# Patient Record
Sex: Female | Born: 1995 | Race: White | Hispanic: No | Marital: Married | Smoking: Never smoker
Health system: Southern US, Community
[De-identification: ages and names within clinical notes are randomized; demographics above are authoritative.]

## PROBLEM LIST (undated history)

## (undated) DIAGNOSIS — N946 Dysmenorrhea, unspecified: Secondary | ICD-10-CM

## (undated) DIAGNOSIS — R7989 Other specified abnormal findings of blood chemistry: Secondary | ICD-10-CM

## (undated) DIAGNOSIS — N390 Urinary tract infection, site not specified: Secondary | ICD-10-CM

## (undated) DIAGNOSIS — F32A Depression, unspecified: Secondary | ICD-10-CM

## (undated) DIAGNOSIS — E039 Hypothyroidism, unspecified: Secondary | ICD-10-CM

## (undated) DIAGNOSIS — F419 Anxiety disorder, unspecified: Secondary | ICD-10-CM

## (undated) DIAGNOSIS — F909 Attention-deficit hyperactivity disorder, unspecified type: Secondary | ICD-10-CM

## (undated) DIAGNOSIS — B009 Herpesviral infection, unspecified: Secondary | ICD-10-CM

## (undated) HISTORY — DX: Urinary tract infection, site not specified: N39.0

## (undated) HISTORY — DX: Hypothyroidism, unspecified: E03.9

## (undated) HISTORY — DX: Dysmenorrhea, unspecified: N94.6

## (undated) HISTORY — DX: Anxiety disorder, unspecified: F41.9

## (undated) HISTORY — PX: WISDOM TOOTH EXTRACTION: SHX21

## (undated) HISTORY — DX: Attention-deficit hyperactivity disorder, unspecified type: F90.9

## (undated) HISTORY — DX: Herpesviral infection, unspecified: B00.9

## (undated) HISTORY — DX: Depression, unspecified: F32.A

## (undated) HISTORY — DX: Other specified abnormal findings of blood chemistry: R79.89

---
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
## 2013-02-04 ENCOUNTER — Other Ambulatory Visit: Payer: Self-pay | Admitting: Nurse Practitioner

## 2013-04-19 ENCOUNTER — Other Ambulatory Visit: Payer: Self-pay | Admitting: Nurse Practitioner

## 2013-04-20 NOTE — Telephone Encounter (Signed)
Last AEX 05/10/2012 Last refill 05/10/2012 x 1 year No future appt.   Will refill for 1 month. Patient will need AEX for further refills.

## 2013-05-28 ENCOUNTER — Other Ambulatory Visit: Payer: Self-pay | Admitting: Nurse Practitioner

## 2013-05-29 NOTE — Telephone Encounter (Signed)
Last refilled: 05/10/12 #90/0refills  AEX scheduled for 06/23/13 with PG  Junel 1/20 #28/0 refills sent in to last patient until AEX

## 2013-06-21 ENCOUNTER — Encounter: Payer: Self-pay | Admitting: Nurse Practitioner

## 2013-06-23 ENCOUNTER — Ambulatory Visit (INDEPENDENT_AMBULATORY_CARE_PROVIDER_SITE_OTHER): Payer: BC Managed Care – PPO | Admitting: Nurse Practitioner

## 2013-06-23 ENCOUNTER — Encounter: Payer: Self-pay | Admitting: Nurse Practitioner

## 2013-06-23 VITALS — BP 120/82 | HR 84 | Ht 64.5 in | Wt 121.0 lb

## 2013-06-23 DIAGNOSIS — Z0000 Encounter for general adult medical examination without abnormal findings: Secondary | ICD-10-CM

## 2013-06-23 DIAGNOSIS — F411 Generalized anxiety disorder: Secondary | ICD-10-CM

## 2013-06-23 DIAGNOSIS — Z01419 Encounter for gynecological examination (general) (routine) without abnormal findings: Secondary | ICD-10-CM

## 2013-06-23 MED ORDER — VALACYCLOVIR HCL 500 MG PO TABS: 500.0000 mg | ORAL_TABLET | Freq: Every day | ORAL | Status: DC

## 2013-06-23 MED ORDER — ESCITALOPRAM OXALATE 10 MG PO TABS: ORAL_TABLET | ORAL | Status: DC

## 2013-06-23 MED ORDER — NORETHIN ACE-ETH ESTRAD-FE 1-20 MG-MCG PO TABS: ORAL_TABLET | ORAL | Status: DC

## 2013-06-23 NOTE — Progress Notes (Signed)
Patient ID: Dana Welch, femalLenise Heralde   DOB: 03/15/1995, 18 y.o.   MRN: [id number] 18 y.o. G0P0 Single Caucasian Fe here for annual exam.  Menses for 3 days. Light to spotting.  Only 1 day of come cramps.  Bloating rare. Same partner for past 4 years.  No concerns about STD's.  [place]  He should be  home for the summer.  Patient is having difficulty with anxiety that seems to be triggered with every day life events.  For instance if she has too much homework, tries to get it done too quickly because the longer she waits gets overwhelmed. She has tried bio feed back techniques like deep breathing to the point that her chest hurts and she gets numbness and tingling of her hands. She has the normal times of presentations that trigger this.   But also if home and nothing going on she feels the need to go somewhere and be with someone so she is not anxious.  Patient's last menstrual period was 05/31/2003.          Sexually active: yes  The current method of family planning is TOCP (estrogen/progesterone).    Exercising: yes  swimming 2-3 times per week.  Just passed lifeguard class. Smoker:  no  Health Maintenance: Pap:  N/A TDaP:  2010 Gardasil:  completed in 2010 Labs:  HB:  Declined Urine:  Negative    reports that she has never smoked. She has never used smokeless tobacco. She reports that she does not drink alcohol or use illicit drugs.  Past Medical History  Diagnosis Date  . HSV-1 infection   . Dysmenorrhea     History reviewed. No pertinent past surgical history.  Current Outpatient Prescriptions  Medication Sig Dispense Refill  . norethindrone-ethinyl estradiol (JUNEL FE 1/20) 1-20 MG-MCG tablet TAKE 1 TABLET EVERY DAY  3 Package  3  . valACYclovir (VALTREX) 500 MG tablet Take 1 tablet (500 mg total) by mouth daily.  30 tablet  11  . escitalopram (LEXAPRO) 10 MG tablet 1/2 tablet daily for 2 weeks then 1 tab daily  90 tablet  0   No current  facility-administered medications for this visit.    Family History  Problem Relation Age of Onset  . Thyroid disease Mother   . Heart attack Maternal Grandfather   . Rashes / Skin problems Maternal Grandfather   . Cancer Paternal Grandmother     kidney  . Hypertension Father     ROS:  Pertinent items are noted in HPI.  Otherwise, a comprehensive ROS was negative.  Exam:   BP 120/82  Pulse 84  Ht 5' 4.5" (1.638 m)  Wt 121 lb (54.885 kg)  BMI 20.46 kg/m2  LMP 05/31/2003 Height: 5' 4.5" (163.8 cm)  Ht Readings from Last 3 Encounters:  06/23/13 5' 4.5" (1.638 m) (53%*, Z = 0.08)   * Growth percentiles are based on CDC 2-20 Years data.    General appearance: alert, cooperative and appears stated age Head: Normocephalic, without obvious abnormality, atraumatic Neck: no adenopathy, supple, symmetrical, trachea midline and thyroid normal to inspection and palpation Lungs: clear to auscultation bilaterally Breasts: normal appearance, no masses or tenderness Heart: regular rate and rhythm Abdomen: soft, non-tender; no masses,  no organomegaly Extremities: extremities normal, atraumatic, no cyanosis or edema Skin: Skin color, texture, turgor normal. No rashes or lesions Lymph nodes: Cervical, supraclavicular, and axillary nodes normal. No abnormal inguinal nodes palpated Neurologic: Grossly normal  Pelvic: External genitalia:  no lesions              Urethra:  normal appearing urethra with no masses, tenderness or lesions              Bartholin's and Skene's: normal                 Vagina: normal appearing vagina with normal color and discharge, no lesions              Cervix: anteverted              Pap taken: no Bimanual Exam:  Uterus:  normal size, contour, position, consistency, mobility, non-tender              Adnexa: no mass, fullness, tenderness               Rectovaginal: Confirms               Anus:  normal sphincter tone, no lesions  A:  Well Woman with normal  exam  Contraception  History of HSV I culture proven genital 2013  Generalized anxiety disorder   P:   Reviewed health and wellness pertinent to exam  Pap smear not taken today  Refill on Junel  Fe 1/20 for a year  Refill on Valtrex for a year  Counseled on breast self exam, STD prevention, HIV risk factors and prevention, use and side effects of OCP's, adequate intake of calcium and vitamin D, diet and exercise return annually or prn  An After Visit Summary was printed and given to the patient.  Consult note:   Discussed treatment options given her age and potential risk.  We do not want her to be on an anti anxiety agent that may become addictive.  Since her symptoms are triggered with different events then something like SSRI may be helpful.  She is made aware of potential side effects and even worsening of symptoms.  If any bad reaction to med's she is to stop and call ASAP.  She is given Lexapro 10 mg 1/2 tablet for 2 weeks then go to 1 tablet a day for 2 months then recheck here for a consult visit.  She is in agreement with plan. 15 minutes spent face to face.

## 2013-06-23 NOTE — Patient Instructions (Addendum)
General topics  Next pap or exam is  due in 1 year Take a Women's multivitamin Take 1200 mg. of calcium daily - prefer dietary If any concerns in interim to call back  Breast Self-Awareness Practicing breast self-awareness may pick up problems early, prevent significant medical complications, and possibly save your life. By practicing breast self-awareness, you can become familiar with how your breasts look and feel and if your breasts are changing. This allows you to notice changes early. It can also offer you some reassurance that your breast health is good. One way to learn what is normal for your breasts and whether your breasts are changing is to do a breast self-exam. If you find a lump or something that was not present in the past, it is best to contact your caregiver right away. Other findings that should be evaluated by your caregiver include nipple discharge, especially if it is bloody; skin changes or reddening; areas where the skin seems to be pulled in (retracted); or new lumps and bumps. Breast pain is seldom associated with cancer (malignancy), but should also be evaluated by a caregiver. BREAST SELF-EXAM The best time to examine your breasts is 5 7 days after your menstrual period is over.  ExitCare Patient Information 2013 [employer].   Exercise to Stay Healthy Exercise helps you become and stay healthy. EXERCISE IDEAS AND TIPS Choose exercises that:  You enjoy.  Fit into your day. You do not need to exercise really hard to be healthy. You can do exercises at a slow or medium level and stay healthy. You can:  Stretch before and after working out.  Try yoga, Pilates, or tai chi.  Lift weights.  Walk fast, swim, jog, run, climb stairs, bicycle, dance, or rollerskate.  Take aerobic classes. Exercises that burn about 150 calories:  Running 1  miles in 15 minutes.  Playing volleyball for 45 to 60 minutes.  Washing and waxing a car for 45 to 60  minutes.  Playing touch football for 45 minutes.  Walking 1  miles in 35 minutes.  Pushing a stroller 1  miles in 30 minutes.  Playing basketball for 30 minutes.  Raking leaves for 30 minutes.  Bicycling 5 miles in 30 minutes.  Walking 2 miles in 30 minutes.  Dancing for 30 minutes.  Shoveling snow for 15 minutes.  Swimming laps for 20 minutes.  Walking up stairs for 15 minutes.  Bicycling 4 miles in 15 minutes.  Gardening for 30 to 45 minutes.  Jumping rope for 15 minutes.  Washing windows or floors for 45 to 60 minutes. Document Released: 03/21/2010 Document Revised: 05/11/2011 Document Reviewed: 03/21/2010 ExitCare Patient Information 2013 ExitCare[employer].   Other topics ( that may be useful information):    Sexually Transmitted Disease Sexually transmitted disease (STD) refers to any infection that is passed from person to person during sexual activity. This may happen by way of saliva, semen, blood, vaginal mucus, or urine. Common STDs include:  Gonorrhea.  Chlamydia.  Syphilis.  HIV/AIDS.  Genital herpes.  Hepatitis B and C.  Trichomonas.  Human papillomavirus (HPV).  Pubic lice. CAUSES  An STD may be spread by bacteria, virus, or parasite. A person can get an STD by:  Sexual intercourse with an infected person.  Sharing sex toys with an infected person.  Sharing needles with an infected person.  Having intimate contact with the genitals, mouth, or rectal areas of an infected person. SYMPTOMS  Some people may not have any symptoms, but   they can still pass the infection to others. Different STDs have different symptoms. Symptoms include:  Painful or bloody urination.  Pain in the pelvis, abdomen, vagina, anus, throat, or eyes.  Skin rash, itching, irritation, growths, or sores (lesions). These usually occur in the genital or anal area.  Abnormal vaginal discharge.  Penile discharge in men.  Soft, flesh-colored skin growths in the  genital or anal area.  Fever.  Pain or bleeding during sexual intercourse.  Swollen glands in the groin area.  Yellow skin and eyes (jaundice). This is seen with hepatitis. DIAGNOSIS  To make a diagnosis, your caregiver may:  Take a medical history.  Perform a physical exam.  Take a specimen (culture) to be examined.  Examine a sample of discharge under a microscope.  Perform blood test TREATMENT   Chlamydia, gonorrhea, trichomonas, and syphilis can be cured with antibiotic medicine.  Genital herpes, hepatitis, and HIV can be treated, but not cured, with prescribed medicines. The medicines will lessen the symptoms.  Genital warts from HPV can be treated with medicine or by freezing, burning (electrocautery), or surgery. Warts may come back.  HPV is a virus and cannot be cured with medicine or surgery.However, abnormal areas may be followed very closely by your caregiver and may be removed from the cervix, vagina, or vulva through office procedures or surgery. If your diagnosis is confirmed, your recent sexual partners need treatment. This is true even if they are symptom-free or have a negative culture or evaluation. They should not have sex until their caregiver says it is okay. HOME CARE INSTRUCTIONS  All sexual partners should be informed, tested, and treated for all STDs.  Take your antibiotics as directed. Finish them even if you start to feel better.  Only take over-the-counter or prescription medicines for pain, discomfort, or fever as directed by your caregiver.  Rest.  Eat a balanced diet and drink enough fluids to keep your urine clear or pale yellow.  Do not have sex until treatment is completed and you have followed up with your caregiver. STDs should be checked after treatment.  Keep all follow-up appointments, Pap tests, and blood tests as directed by your caregiver.  Only use latex condoms and water-soluble lubricants during sexual activity. Do not use  petroleum jelly or oils.  Avoid alcohol and illegal drugs.  Get vaccinated for HPV and hepatitis. If you have not received these vaccines in the past, talk to your caregiver about whether one or both might be right for you.  Avoid risky sex practices that can break the skin. The only way to avoid getting an STD is to avoid all sexual activity.Latex condoms and dental dams (for oral sex) will help lessen the risk of getting an STD, but will not completely eliminate the risk. SEEK MEDICAL CARE IF:   You have a fever.  You have any new or worsening symptoms. Document Released: 05/09/2002 Document Revised: 05/11/2011 Document Reviewed: 05/16/2010 [facility] Patient Information 2013 [place].    Domestic Abuse You are being battered or abused if someone close to you hits, pushes, or physically hurts you in any way. You also are being abused if you are forced into activities. You are being sexually abused if you are forced to have sexual contact of any kind. You are being emotionally abused if you are made to feel worthless or if you are constantly threatened. It is important to remember that help is available. No one has the right to abuse you. PREVENTION OF FURTHER  ABUSE  Learn the warning signs of danger. This varies with situations but may include: the use of alcohol, threats, isolation from friends and family, or forced sexual contact. Leave if you feel that violence is going to occur.  If you are attacked or beaten, report it to the police so the abuse is documented. You do not have to press charges. The police can protect you while you or the attackers are leaving. Get the officer's name and badge number and a copy of the report.  Find someone you can trust and tell them what is happening to you: your caregiver, a nurse, clergy member, close friend or family member. Feeling ashamed is natural, but remember that you have done nothing wrong. No one deserves abuse. Document Released:  02/14/2000 Document Revised: 05/11/2011 Document Reviewed: 04/24/2010 ExitCare Patient Information 2013 ExitCare, LLC.    How Much is Too Much Alcohol? Drinking too much alcohol can cause injury, accidents, and health problems. These types of problems can include:   Car crashes.  Falls.  Family fighting (domestic violence).  Drowning.  Fights.  Injuries.  Burns.  Damage to certain organs.  Having a baby with birth defects. ONE DRINK CAN BE TOO MUCH WHEN YOU ARE:  Working.  Pregnant or breastfeeding.  Taking medicines. Ask your doctor.  Driving or planning to drive. If you or someone you know has a drinking problem, get help from a doctor.  Document Released: 12/13/2008 Document Revised: 05/11/2011 Document Reviewed: 12/13/2008 ExitCare Patient Information 2013 ExitCare, LLC.   Smoking Hazards Smoking cigarettes is extremely bad for your health. Tobacco smoke has over 200 known poisons in it. There are over 60 chemicals in tobacco smoke that cause cancer. Some of the chemicals found in cigarette smoke include:   Cyanide.  Benzene.  Formaldehyde.  Methanol (wood alcohol).  Acetylene (fuel used in welding torches).  Ammonia. Cigarette smoke also contains the poisonous gases nitrogen oxide and carbon monoxide.  Cigarette smokers have an increased risk of many serious medical problems and Smoking causes approximately:  90% of all lung cancer deaths in men.  80% of all lung cancer deaths in women.  90% of deaths from chronic obstructive lung disease. Compared with nonsmokers, smoking increases the risk of:  Coronary heart disease by 2 to 4 times.  Stroke by 2 to 4 times.  Men developing lung cancer by 23 times.  Women developing lung cancer by 13 times.  Dying from chronic obstructive lung diseases by 12 times.  . Smoking is the most preventable cause of death and disease in our society.  WHY IS SMOKING ADDICTIVE?  Nicotine is the chemical  agent in tobacco that is capable of causing addiction or dependence.  When you smoke and inhale, nicotine is absorbed rapidly into the bloodstream through your lungs. Nicotine absorbed through the lungs is capable of creating a powerful addiction. Both inhaled and non-inhaled nicotine may be addictive.  Addiction studies of cigarettes and spit tobacco show that addiction to nicotine occurs mainly during the teen years, when young people begin using tobacco products. WHAT ARE THE BENEFITS OF QUITTING?  There are many health benefits to quitting smoking.   Likelihood of developing cancer and heart disease decreases. Health improvements are seen almost immediately.  Blood pressure, pulse rate, and breathing patterns start returning to normal soon after quitting. QUITTING SMOKING   American Lung Association - 1-800-LUNGUSA  American Cancer Society - 1-800-ACS-2345 Document Released: 03/26/2004 Document Revised: 05/11/2011 Document Reviewed: 11/28/2008 ExitCare Patient Information 2013 ExitCare,   LLC.   Stress Management Stress is a state of physical or mental tension that often results from changes in your life or normal routine. Some common causes of stress are:  Death of a loved one.  Injuries or severe illnesses.  Getting fired or changing jobs.  Moving into a new home. Other causes may be:  Sexual problems.  Business or financial losses.  Taking on a large debt.  Regular conflict with someone at home or at work.  Constant tiredness from lack of sleep. It is not just bad things that are stressful. It may be stressful to:  Win the lottery.  Get married.  Buy a new car. The amount of stress that can be easily tolerated varies from person to person. Changes generally cause stress, regardless of the types of change. Too much stress can affect your health. It may lead to physical or emotional problems. Too little stress (boredom) may also become stressful. SUGGESTIONS TO  REDUCE STRESS:  Talk things over with your family and friends. It often is helpful to share your concerns and worries. If you feel your problem is serious, you may want to get help from a professional counselor.  Consider your problems one at a time instead of lumping them all together. Trying to take care of everything at once may seem impossible. List all the things you need to do and then start with the most important one. Set a goal to accomplish 2 or 3 things each day. If you expect to do too many in a single day you will naturally fail, causing you to feel even more stressed.  Do not use alcohol or drugs to relieve stress. Although you may feel better for a short time, they do not remove the problems that caused the stress. They can also be habit forming.  Exercise regularly - at least 3 times per week. Physical exercise can help to relieve that "uptight" feeling and will relax you.  The shortest distance between despair and hope is often a good night's sleep.  Go to bed and get up on time allowing yourself time for appointments without being rushed.  Take a short "time-out" period from any stressful situation that occurs during the day. Close your eyes and take some deep breaths. Starting with the muscles in your face, tense them, hold it for a few seconds, then relax. Repeat this with the muscles in your neck, shoulders, hand, stomach, back and legs.  Take good care of yourself. Eat a balanced diet and get plenty of rest.  Schedule time for having fun. Take a break from your daily routine to relax. HOME CARE INSTRUCTIONS   Call if you feel overwhelmed by your problems and feel you can no longer manage them on your own.  Return immediately if you feel like hurting yourself or someone else. Document Released: 08/12/2000 Document Revised: 05/11/2011 Document Reviewed: 04/04/2007 [facility] Patient Information 2013 [place], [place].     1/2 tablet of Lexapro daily for 2 weeks, then 1  tablet daily Recheck in 2 months

## 2013-06-25 NOTE — Progress Notes (Signed)
Encounter reviewed by Dr. Brook Silva.  

## 2013-09-19 ENCOUNTER — Ambulatory Visit (INDEPENDENT_AMBULATORY_CARE_PROVIDER_SITE_OTHER): Payer: BC Managed Care – PPO | Admitting: Nurse Practitioner

## 2013-09-19 ENCOUNTER — Encounter: Payer: Self-pay | Admitting: Nurse Practitioner

## 2013-09-19 VITALS — BP 118/80 | HR 88 | Resp 16 | Ht 64.5 in | Wt 123.0 lb

## 2013-09-19 DIAGNOSIS — F411 Generalized anxiety disorder: Secondary | ICD-10-CM

## 2013-09-19 MED ORDER — ESCITALOPRAM OXALATE 10 MG PO TABS: ORAL_TABLET | ORAL | Status: DC

## 2013-09-19 NOTE — Patient Instructions (Signed)
If any mood or thoughts of harming yourself or others.

## 2013-09-19 NOTE — Progress Notes (Signed)
Patient ID: Dana Welch, female   DOB: 12/23/1995, 18 y.o.   MRN: [id number] S: This 18 yo WS Fe comes in for a follow up with history of anxiety.  She ws started on Lexapro 10 mg daily at the end of April.  Her symptoms of anxiety was bad enough that interfered with daily activities.  Since being on Lexapro, she has been more clam and able to get task done without feeling overwhelmed.  Parents have also noted that she is more calm and less tense.  She also feels less anxious. Sleep is good.  Now notices some PMS only prior to cycle.  Doing Ok on OCP.  Menses last 3-4 days.  Same partner who plays ball and travels a lot.    O: Appropriate in response and good insight into her recent symptoms.  Seems to be pleased with outcome.  Denies suicidal or homicidal ideation.  Plan:   Will continue with Lexapro 10 mg daily, then increase to 15 mg 1 1/2 tablet the week prior to menses.  If any changes in mood or thoughts of harm to call back.  She is in agreement with plan.  Her AEX is due in the spring but will be calling back in 3 months with a follow up on her progress.  If nay questions or concerns to e-mail or call back.  School starts for her in a month.   Consult time: 15 minutes face to face conversation

## 2013-09-24 ENCOUNTER — Encounter: Payer: Self-pay | Admitting: Nurse Practitioner

## 2013-09-28 NOTE — Progress Notes (Signed)
Encounter reviewed by Dr. Daviona Herbert Silva.  

## 2014-05-27 ENCOUNTER — Other Ambulatory Visit: Payer: Self-pay | Admitting: Nurse Practitioner

## 2014-05-28 ENCOUNTER — Telehealth: Payer: Self-pay | Admitting: Nurse Practitioner

## 2014-05-28 NOTE — Telephone Encounter (Signed)
Pt returning message regarding scheduling aex. Pt was called based on refill request. Pt scheduled aex with Patty in June.

## 2014-05-28 NOTE — Telephone Encounter (Signed)
Medication refill request: Junel Last AEX:  06/23/13 PG Next AEX: not scheduled  Last MMG (if hormonal medication request): none Refill authorized: 06/23/13 #3packs/3R. Today #1pack/0R?  LM for pt to call back to schedule AEX.

## 2014-06-08 ENCOUNTER — Other Ambulatory Visit: Payer: Self-pay | Admitting: Nurse Practitioner

## 2014-06-08 NOTE — Telephone Encounter (Signed)
Will need to hold for Patty due to age

## 2014-06-08 NOTE — Telephone Encounter (Signed)
Medication refill request: Lexapro 10 mg  Last AEX:  06/23/13 with PG  Next AEX:  08/08/14 with PG Last MMG (if hormonal medication request): N/A Refill authorized: #90/0 rfs, please advise.  (Routed to DL)

## 2014-06-12 NOTE — Telephone Encounter (Signed)
pts pharmacy called to check status of refill.

## 2014-06-12 NOTE — Telephone Encounter (Signed)
Message left for pt to return call regarding refill request. RE:  3 month status update

## 2014-06-12 NOTE — Telephone Encounter (Signed)
Dana CornfieldStephanie please call patient for status of patient on Lexapro, and then [facility]

## 2014-06-14 NOTE — Telephone Encounter (Signed)
Returning a call to Stephanie. °

## 2014-06-14 NOTE — Telephone Encounter (Signed)
I spoke with the patient.  She states she is doing better and she is not as anxious as before.  Pt states medication is working well.  She increases dose week before menses and has not noticed any PMS.  She is sleeping well.  Please advise refill.  Pt has AEX with PG on 08/08/14.

## 2014-06-29 ENCOUNTER — Other Ambulatory Visit: Payer: Self-pay | Admitting: Nurse Practitioner

## 2014-06-29 NOTE — Telephone Encounter (Signed)
It will not allow me to sign this RX - states wrong pharmacy

## 2014-06-29 NOTE — Telephone Encounter (Signed)
Medication refill request: junel  Last AEX:  06/23/13 PG Next AEX: 08/08/14 PG  Last MMG (if hormonal medication request): none Refill authorized: 05/28/14 #1pack/0R. Today #1pack/1R?

## 2014-06-29 NOTE — Telephone Encounter (Signed)
[facility] [place]. Patient notified.   FYI Mrs Alexia Freestoneatty  Encounter closed.

## 2014-06-29 NOTE — Telephone Encounter (Signed)
Patient needs refill of Junel today if possible. [place] [facility] and [facility] [place].

## 2014-08-08 ENCOUNTER — Ambulatory Visit (INDEPENDENT_AMBULATORY_CARE_PROVIDER_SITE_OTHER): Payer: BC Managed Care – PPO | Admitting: Nurse Practitioner

## 2014-08-08 ENCOUNTER — Encounter: Payer: Self-pay | Admitting: Nurse Practitioner

## 2014-08-08 VITALS — BP 110/70 | HR 92 | Resp 20 | Ht 64.5 in | Wt 130.0 lb

## 2014-08-08 DIAGNOSIS — Z0000 Encounter for general adult medical examination without abnormal findings: Secondary | ICD-10-CM

## 2014-08-08 DIAGNOSIS — Z01419 Encounter for gynecological examination (general) (routine) without abnormal findings: Secondary | ICD-10-CM | POA: Diagnosis not present

## 2014-08-08 LAB — POCT URINALYSIS DIPSTICK
BILIRUBIN UA: NEGATIVE
Blood, UA: NEGATIVE
GLUCOSE UA: NEGATIVE
KETONES UA: NEGATIVE
NITRITE UA: NEGATIVE
PROTEIN UA: NEGATIVE
UROBILINOGEN UA: NEGATIVE
pH, UA: 7

## 2014-08-08 MED ORDER — NORETHIN ACE-ETH ESTRAD-FE 1-20 MG-MCG PO TABS: 1.0000 | ORAL_TABLET | Freq: Every day | ORAL | Status: DC

## 2014-08-08 MED ORDER — ESCITALOPRAM OXALATE 10 MG PO TABS: ORAL_TABLET | ORAL | Status: DC

## 2014-08-08 NOTE — Progress Notes (Signed)
19 y.o. G0P0 Single  Caucasian Fe here for annual exam. Menses now at 3-5 days. Moderate to light.  Usually no cramps but if cramps they are tolerable. Now changed majors and hopes to get into PA program.  Boyfriend is now back home and not playing ball at this time -may try out for [employer].  Doing very well on Lexapro.  Feels she is less anxious and able to work and focus better.  Friends and family feel that she is better on med's.  Patient's last menstrual period was 07/31/2014.          Sexually active: Yes.    The current method of family planning is OCP (estrogen/progesterone).    Exercising: Yes.    Jogging and walking  Smoker:  no  Health Maintenance: Pap:  Never Self Breast Exam: yes, twice a month Gardasil completed in 2010 TDaP:  2010 Labs: HGB:    UA: trace leuk's   reports that she has never smoked. She has never used smokeless tobacco. She reports that she does not drink alcohol or use illicit drugs.  Past Medical History  Diagnosis Date  . HSV-1 infection   . Dysmenorrhea     History reviewed. No pertinent past surgical history.  Current Outpatient Prescriptions  Medication Sig Dispense Refill  . escitalopram (LEXAPRO) 10 MG tablet TAKE 1 TAB DAILY THEN 1- 1/2 TABLET 1 WEEK PRIOR TO MENSES 90 tablet 3  . norethindrone-ethinyl estradiol (JUNEL FE 1/20) 1-20 MG-MCG tablet Take 1 tablet by mouth daily. 3 Package 3  . valACYclovir (VALTREX) 500 MG tablet Take 500 mg by mouth daily.     No current facility-administered medications for this visit.    Family History  Problem Relation Age of Onset  . Thyroid disease Mother   . Heart attack Maternal Grandfather   . Rashes / Skin problems Maternal Grandfather   . Cancer Paternal Grandmother     kidney  . Hypertension Father     ROS:  Pertinent items are noted in HPI.  Otherwise, a comprehensive ROS was negative.  Exam:   BP 110/70 mmHg  Pulse 92  Resp 20  Ht 5' 4.5" (1.638 m)  Wt 130 lb (58.968 kg)  BMI  21.98 kg/m2  LMP 07/31/2014 Height: 5' 4.5" (163.8 cm) Ht Readings from Last 3 Encounters:  08/08/14 5' 4.5" (1.638 m)  09/19/13 5' 4.5" (1.638 m) (53 %*, Z = 0.08)  06/23/13 5' 4.5" (1.638 m) (53 %*, Z = 0.08)   * Growth percentiles are based on CDC 2-20 Years data.    General appearance: alert, cooperative and appears stated age Head: Normocephalic, without obvious abnormality, atraumatic Neck: no adenopathy, supple, symmetrical, trachea midline and thyroid normal to inspection and palpation Lungs: clear to auscultation bilaterally Breasts: normal appearance, no masses or tenderness Heart: regular rate and rhythm Abdomen: soft, non-tender; no masses,  no organomegaly Extremities: extremities normal, atraumatic, no cyanosis or edema Skin: Skin color, texture, turgor normal. No rashes or lesions Lymph nodes: Cervical, supraclavicular, and axillary nodes normal. No abnormal inguinal nodes palpated Neurologic: Grossly normal   Pelvic: External genitalia:  no lesions              Urethra:  normal appearing urethra with no masses, tenderness or lesions              Bartholin's and Skene's: normal                 Vagina: normal appearing vagina with normal  color and discharge, no lesions              Cervix: anteverted              Pap taken: No. Bimanual Exam:  Uterus:  normal size, contour, position, consistency, mobility, non-tender              Adnexa: no mass, fullness, tenderness               Rectovaginal: Confirms               Anus:  normal sphincter tone, no lesions  Chaperone present: No  A:  Well Woman with normal exam  Contraception History of HSV I culture proven genital 2013 Generalized anxiety disorder - doing well on med's  P:   Reviewed health and wellness pertinent to exam  Pap smear as above  Refill Lexapro for a year  Refill on Junel Fe 1/20 for a year  Counseled on breast self exam, use and side effects of OCP's, adequate intake  of calcium and vitamin D, diet and exercise return annually or prn  An After Visit Summary was printed and given to the patient.

## 2014-08-08 NOTE — Patient Instructions (Signed)
General topics  Next pap or exam is  due in 1 year Take a Women's multivitamin Take 1200 mg. of calcium daily - prefer dietary If any concerns in interim to call back  Breast Self-Awareness Practicing breast self-awareness may pick up problems early, prevent significant medical complications, and possibly save your life. By practicing breast self-awareness, you can become familiar with how your breasts look and feel and if your breasts are changing. This allows you to notice changes early. It can also offer you some reassurance that your breast health is good. One way to learn what is normal for your breasts and whether your breasts are changing is to do a breast self-exam. If you find a lump or something that was not present in the past, it is best to contact your caregiver right away. Other findings that should be evaluated by your caregiver include nipple discharge, especially if it is bloody; skin changes or reddening; areas where the skin seems to be pulled in (retracted); or new lumps and bumps. Breast pain is seldom associated with cancer (malignancy), but should also be evaluated by a caregiver. BREAST SELF-EXAM The best time to examine your breasts is 5 7 days after your menstrual period is over.  ExitCare Patient Information 2013 [employer].   Exercise to Stay Healthy Exercise helps you become and stay healthy. EXERCISE IDEAS AND TIPS Choose exercises that:  You enjoy.  Fit into your day. You do not need to exercise really hard to be healthy. You can do exercises at a slow or medium level and stay healthy. You can:  Stretch before and after working out.  Try yoga, Pilates, or tai chi.  Lift weights.  Walk fast, swim, jog, run, climb stairs, bicycle, dance, or rollerskate.  Take aerobic classes. Exercises that burn about 150 calories:  Running 1  miles in 15 minutes.  Playing volleyball for 45 to 60 minutes.  Washing and waxing a car for 45 to 60  minutes.  Playing touch football for 45 minutes.  Walking 1  miles in 35 minutes.  Pushing a stroller 1  miles in 30 minutes.  Playing basketball for 30 minutes.  Raking leaves for 30 minutes.  Bicycling 5 miles in 30 minutes.  Walking 2 miles in 30 minutes.  Dancing for 30 minutes.  Shoveling snow for 15 minutes.  Swimming laps for 20 minutes.  Walking up stairs for 15 minutes.  Bicycling 4 miles in 15 minutes.  Gardening for 30 to 45 minutes.  Jumping rope for 15 minutes.  Washing windows or floors for 45 to 60 minutes. Document Released: 03/21/2010 Document Revised: 05/11/2011 Document Reviewed: 03/21/2010 ExitCare Patient Information 2013 [employer]Care[employer].   Other topics ( that may be useful information):    Sexually Transmitted Disease Sexually transmitted disease (STD) refers to any infection that is passed from person to person during sexual activity. This may happen by way of saliva, semen, blood, vaginal mucus, or urine. Common STDs include:  Gonorrhea.  Chlamydia.  Syphilis.  HIV/AIDS.  Genital herpes.  Hepatitis B and C.  Trichomonas.  Human papillomavirus (HPV).  Pubic lice. CAUSES  An STD may be spread by bacteria, virus, or parasite. A person can get an STD by:  Sexual intercourse with an infected person.  Sharing sex toys with an infected person.  Sharing needles with an infected person.  Having intimate contact with the genitals, mouth, or rectal areas of an infected person. SYMPTOMS  Some people may not have any symptoms, but   they can still pass the infection to others. Different STDs have different symptoms. Symptoms include:  Painful or bloody urination.  Pain in the pelvis, abdomen, vagina, anus, throat, or eyes.  Skin rash, itching, irritation, growths, or sores (lesions). These usually occur in the genital or anal area.  Abnormal vaginal discharge.  Penile discharge in men.  Soft, flesh-colored skin growths in the  genital or anal area.  Fever.  Pain or bleeding during sexual intercourse.  Swollen glands in the groin area.  Yellow skin and eyes (jaundice). This is seen with hepatitis. DIAGNOSIS  To make a diagnosis, your caregiver may:  Take a medical history.  Perform a physical exam.  Take a specimen (culture) to be examined.  Examine a sample of discharge under a microscope.  Perform blood test TREATMENT   Chlamydia, gonorrhea, trichomonas, and syphilis can be cured with antibiotic medicine.  Genital herpes, hepatitis, and HIV can be treated, but not cured, with prescribed medicines. The medicines will lessen the symptoms.  Genital warts from HPV can be treated with medicine or by freezing, burning (electrocautery), or surgery. Warts may come back.  HPV is a virus and cannot be cured with medicine or surgery.However, abnormal areas may be followed very closely by your caregiver and may be removed from the cervix, vagina, or vulva through office procedures or surgery. If your diagnosis is confirmed, your recent sexual partners need treatment. This is true even if they are symptom-free or have a negative culture or evaluation. They should not have sex until their caregiver says it is okay. HOME CARE INSTRUCTIONS  All sexual partners should be informed, tested, and treated for all STDs.  Take your antibiotics as directed. Finish them even if you start to feel better.  Only take over-the-counter or prescription medicines for pain, discomfort, or fever as directed by your caregiver.  Rest.  Eat a balanced diet and drink enough fluids to keep your urine clear or pale yellow.  Do not have sex until treatment is completed and you have followed up with your caregiver. STDs should be checked after treatment.  Keep all follow-up appointments, Pap tests, and blood tests as directed by your caregiver.  Only use latex condoms and water-soluble lubricants during sexual activity. Do not use  petroleum jelly or oils.  Avoid alcohol and illegal drugs.  Get vaccinated for HPV and hepatitis. If you have not received these vaccines in the past, talk to your caregiver about whether one or both might be right for you.  Avoid risky sex practices that can break the skin. The only way to avoid getting an STD is to avoid all sexual activity.Latex condoms and dental dams (for oral sex) will help lessen the risk of getting an STD, but will not completely eliminate the risk. SEEK MEDICAL CARE IF:   You have a fever.  You have any new or worsening symptoms. Document Released: 05/09/2002 Document Revised: 05/11/2011 Document Reviewed: 05/16/2010 [facility] Patient Information 2013 [place].    Domestic Abuse You are being battered or abused if someone close to you hits, pushes, or physically hurts you in any way. You also are being abused if you are forced into activities. You are being sexually abused if you are forced to have sexual contact of any kind. You are being emotionally abused if you are made to feel worthless or if you are constantly threatened. It is important to remember that help is available. No one has the right to abuse you. PREVENTION OF FURTHER  ABUSE  Learn the warning signs of danger. This varies with situations but may include: the use of alcohol, threats, isolation from friends and family, or forced sexual contact. Leave if you feel that violence is going to occur.  If you are attacked or beaten, report it to the police so the abuse is documented. You do not have to press charges. The police can protect you while you or the attackers are leaving. Get the officer's name and badge number and a copy of the report.  Find someone you can trust and tell them what is happening to you: your caregiver, a nurse, clergy member, close friend or family member. Feeling ashamed is natural, but remember that you have done nothing wrong. No one deserves abuse. Document Released:  02/14/2000 Document Revised: 05/11/2011 Document Reviewed: 04/24/2010 ExitCare Patient Information 2013 [employer].    How Much is Too Much Alcohol? Drinking too much alcohol can cause injury, accidents, and health problems. These types of problems can include:   Car crashes.  Falls.  Family fighting (domestic violence).  Drowning.  Fights.  Injuries.  Burns.  Damage to certain organs.  Having a baby with birth defects. ONE DRINK CAN BE TOO MUCH WHEN YOU ARE:  Working.  Pregnant or breastfeeding.  Taking medicines. Ask your doctor.  Driving or planning to drive. If you or someone you know has a drinking problem, get help from a doctor.  Document Released: 12/13/2008 Document Revised: 05/11/2011 Document Reviewed: 12/13/2008 ExitCare Patient Information 2013 [employer].   Smoking Hazards Smoking cigarettes is extremely bad for your health. Tobacco smoke has over 200 known poisons in it. There are over 60 chemicals in tobacco smoke that cause cancer. Some of the chemicals found in cigarette smoke include:   Cyanide.  Benzene.  Formaldehyde.  Methanol (wood alcohol).  Acetylene (fuel used in welding torches).  Ammonia. Cigarette smoke also contains the poisonous gases nitrogen oxide and carbon monoxide.  Cigarette smokers have an increased risk of many serious medical problems and Smoking causes approximately:  90% of all lung cancer deaths in men.  80% of all lung cancer deaths in women.  90% of deaths from chronic obstructive lung disease. Compared with nonsmokers, smoking increases the risk of:  Coronary heart disease by 2 to 4 times.  Stroke by 2 to 4 times.  Men developing lung cancer by 23 times.  Women developing lung cancer by 13 times.  Dying from chronic obstructive lung diseases by 12 times.  . Smoking is the most preventable cause of death and disease in our society.  WHY IS SMOKING ADDICTIVE?  Nicotine is the chemical  agent in tobacco that is capable of causing addiction or dependence.  When you smoke and inhale, nicotine is absorbed rapidly into the bloodstream through your lungs. Nicotine absorbed through the lungs is capable of creating a powerful addiction. Both inhaled and non-inhaled nicotine may be addictive.  Addiction studies of cigarettes and spit tobacco show that addiction to nicotine occurs mainly during the teen years, when young people begin using tobacco products. WHAT ARE THE BENEFITS OF QUITTING?  There are many health benefits to quitting smoking.   Likelihood of developing cancer and heart disease decreases. Health improvements are seen almost immediately.  Blood pressure, pulse rate, and breathing patterns start returning to normal soon after quitting. QUITTING SMOKING   American Lung Association - 1-800-LUNGUSA  American Cancer Society - 1-800-ACS-2345 Document Released: 03/26/2004 Document Revised: 05/11/2011 Document Reviewed: 11/28/2008 ExitCare Patient Information 2013 ExitCare,   LLC.   Stress Management Stress is a state of physical or mental tension that often results from changes in your life or normal routine. Some common causes of stress are:  Death of a loved one.  Injuries or severe illnesses.  Getting fired or changing jobs.  Moving into a new home. Other causes may be:  Sexual problems.  Business or financial losses.  Taking on a large debt.  Regular conflict with someone at home or at work.  Constant tiredness from lack of sleep. It is not just bad things that are stressful. It may be stressful to:  Win the lottery.  Get married.  Buy a new car. The amount of stress that can be easily tolerated varies from person to person. Changes generally cause stress, regardless of the types of change. Too much stress can affect your health. It may lead to physical or emotional problems. Too little stress (boredom) may also become stressful. SUGGESTIONS TO  REDUCE STRESS:  Talk things over with your family and friends. It often is helpful to share your concerns and worries. If you feel your problem is serious, you may want to get help from a professional counselor.  Consider your problems one at a time instead of lumping them all together. Trying to take care of everything at once may seem impossible. List all the things you need to do and then start with the most important one. Set a goal to accomplish 2 or 3 things each day. If you expect to do too many in a single day you will naturally fail, causing you to feel even more stressed.  Do not use alcohol or drugs to relieve stress. Although you may feel better for a short time, they do not remove the problems that caused the stress. They can also be habit forming.  Exercise regularly - at least 3 times per week. Physical exercise can help to relieve that "uptight" feeling and will relax you.  The shortest distance between despair and hope is often a good night's sleep.  Go to bed and get up on time allowing yourself time for appointments without being rushed.  Take a short "time-out" period from any stressful situation that occurs during the day. Close your eyes and take some deep breaths. Starting with the muscles in your face, tense them, hold it for a few seconds, then relax. Repeat this with the muscles in your neck, shoulders, hand, stomach, back and legs.  Take good care of yourself. Eat a balanced diet and get plenty of rest.  Schedule time for having fun. Take a break from your daily routine to relax. HOME CARE INSTRUCTIONS   Call if you feel overwhelmed by your problems and feel you can no longer manage them on your own.  Return immediately if you feel like hurting yourself or someone else. Document Released: 08/12/2000 Document Revised: 05/11/2011 Document Reviewed: 04/04/2007 ExitCare Patient Information 2013 [employer].  

## 2014-08-08 NOTE — Progress Notes (Signed)
Encounter reviewed by Dr. Brook Silva.  

## 2014-08-31 ENCOUNTER — Other Ambulatory Visit: Payer: Self-pay | Admitting: Certified Nurse Midwife

## 2014-08-31 NOTE — Telephone Encounter (Signed)
08/08/14 #[id number] [facility]

## 2014-11-23 ENCOUNTER — Encounter: Payer: Self-pay | Admitting: Family

## 2014-11-23 ENCOUNTER — Ambulatory Visit (INDEPENDENT_AMBULATORY_CARE_PROVIDER_SITE_OTHER): Payer: BC Managed Care – PPO | Admitting: Family

## 2014-11-23 VITALS — BP 110/68 | HR 100 | Temp 98.4°F | Ht 64.5 in | Wt 130.0 lb

## 2014-11-23 DIAGNOSIS — F909 Attention-deficit hyperactivity disorder, unspecified type: Secondary | ICD-10-CM | POA: Insufficient documentation

## 2014-11-23 DIAGNOSIS — F411 Generalized anxiety disorder: Secondary | ICD-10-CM | POA: Insufficient documentation

## 2014-11-23 MED ORDER — VENLAFAXINE HCL ER 37.5 MG PO CP24: 37.5000 mg | ORAL_CAPSULE | Freq: Every day | ORAL | Status: DC

## 2014-11-23 NOTE — Patient Instructions (Signed)
Thank you for choosing [facility].  Summary/Instructions:  Your prescription(s) have been submitted to your pharmacy or been printed and provided for you. Please take as directed and contact our office if you believe you are having problem(s) with the medication(s) or have any questions.  If your symptoms worsen or fail to improve, please contact our office for further instruction, or in case of emergency go directly to the emergency room at the closest medical facility.    Amphetamine; Dextroamphetamine extended-release capsules What is this medicine? AMPHETAMINE; DEXTROAMPHETAMINE (am FET a meen; dex troe am FET a meen) is used to treat attention-deficit hyperactivity disorder (ADHD). Federal law prohibits giving this medicine to any person other than the person for whom it was prescribed. Do not share this medicine with anyone else. This medicine may be used for other purposes; ask your health care provider or pharmacist if you have questions. COMMON BRAND NAME(S): Adderall XR What should I tell my health care provider before I take this medicine? They need to know if you have any of these conditions: -anxiety or panic attacks -circulation problems in fingers and toes -glaucoma -hardening or blockages of the arteries or heart blood vessels -heart disease or a heart defect -high blood pressure -history of a drug or alcohol abuse problem -history of stroke -kidney disease -liver disease -mental illness -seizures -suicidal thoughts, plans, or attempt; a previous suicide attempt by you or a family member -thyroid disease -Tourette's syndrome -an unusual or allergic reaction to dextroamphetamine, other amphetamines, other medicines, foods, dyes, or preservatives -pregnant or trying to get pregnant -breast-feeding How should I use this medicine? Take this medicine by mouth with a glass of water. Follow the directions on the prescription label. This medicine is taken just one  time per day, usually in the morning after waking up. Take with or without food. Do not chew or crush this medicine. You may open the capsules and sprinkle the medicine onto a spoon of applesauce. If sprinkled on applesauce, take the dose immediately and do not crush or chew. Always drink a glass of water or other liquid after taking this medicine. Do not take your medicine more often than directed. A special MedGuide will be given to you by the pharmacist with each prescription and refill. Be sure to read this information carefully each time. Talk to your pediatrician regarding the use of this medicine in children. While this drug may be prescribed for children as young as 6 years for selected conditions, precautions do apply. Overdosage: If you think you have taken too much of this medicine contact a poison control center or emergency room at once. NOTE: This medicine is only for you. Do not share this medicine with others. What if I miss a dose? If you miss a dose, take it as soon as you can. If it is almost time for your next dose, take only that dose. Do not take double or extra doses. What may interact with this medicine? Do not take this medicine with any of the following medications: -certain medicines for migraine headache like almotriptan, eletriptan, frovatriptan, naratriptan, rizatriptan, sumatriptan, zolmitriptan -MAOIs like Carbex, Eldepryl, Marplan, Nardil, and Parnate -meperidine -other stimulant medicines for attention disorders, weight loss, or to stay awake -pimozide -procarbazine This medicine may also interact with the following medications: -acetazolamide -ammonium chloride -antacids -ascorbic acid -atomoxetine -caffeine -certain medicines for blood pressure -certain medicines for depression, anxiety, or psychotic disturbances -certain medicines for diabetes -certain medicines for seizures like carbamazepine, phenobarbital, phenytoin -certain  medicines for stomach  problems like cimetidine, famotidine, omeprazole, lansoprazole -cold or allergy medicines -glutamic acid -methenamine -narcotic medicines for pain -norepinephrine -phenothiazines like chlorpromazine, mesoridazine, prochlorperazine, thioridazine -sodium acid phosphate -sodium bicarbonate This list may not describe all possible interactions. Give your health care provider a list of all the medicines, herbs, non-prescription drugs, or dietary supplements you use. Also tell them if you smoke, drink alcohol, or use illegal drugs. Some items may interact with your medicine. What should I watch for while using this medicine? Visit your doctor or health care professional for regular checks on your progress. This prescription requires that you follow special procedures with your doctor and pharmacy. You will need to have a new written prescription from your doctor every time you need a refill. This medicine may affect your concentration, or hide signs of tiredness. Until you know how this medicine affects you, do not drive, ride a bicycle, use machinery, or do anything that needs mental alertness. Tell your doctor or health care professional if this medicine loses its effects, or if you feel you need to take more than the prescribed amount. Do not change the dosage without talking to your doctor or health care professional. Decreased appetite is a common side effect when starting this medicine. Eating small, frequent meals or snacks can help. Talk to your doctor if you continue to have poor eating habits. Height and weight growth of a child taking this medicine will be monitored closely. Do not take this medicine close to bedtime. It may prevent you from sleeping. If you are going to need surgery, an MRI, a CT scan, or other procedure, tell your doctor that you are taking this medicine. You may need to stop taking this medicine before the procedure. Tell your doctor or healthcare professional right away if  you notice unexplained wounds on your fingers and toes while taking this medicine. You should also tell your healthcare provider if you experience numbness or pain, changes in the skin color, or sensitivity to temperature in your fingers or toes. What side effects may I notice from receiving this medicine? Side effects that you should report to your doctor or health care professional as soon as possible: -allergic reactions like skin rash, itching or hives, swelling of the face, lips, or tongue -changes in vision -chest pain or chest tightness -confusion, trouble speaking or understanding -fast, irregular heartbeat -fingers or toes feel numb, cool, painful -hallucination, loss of contact with reality -high blood pressure -males: prolonged or painful erection -seizures -severe headaches -shortness of breath -suicidal thoughts or other mood changes -trouble walking, dizziness, loss of balance or coordination -uncontrollable head, mouth, neck, arm, or leg movements Side effects that usually do not require medical attention (report to your doctor or health care professional if they continue or are bothersome): -anxious -headache -loss of appetite -nausea, vomiting -trouble sleeping -weight loss This list may not describe all possible side effects. Call your doctor for medical advice about side effects. You may report side effects to FDA at 1-800-FDA-1088. Where should I keep my medicine? Keep out of the reach of children. This medicine can be abused. Keep your medicine in a safe place to protect it from theft. Do not share this medicine with anyone. Selling or giving away this medicine is dangerous and against the law. Store at room temperature between 15 and 30 degrees C (59 and 86 degrees F). Keep container tightly closed. Protect from light. Throw away any unused medicine after the expiration date. NOTE: This  sheet is a summary. It may not cover all possible information. If you have  questions about this medicine, talk to your doctor, pharmacist, or health care provider.  2015, Elsevier/Gold Standard. (2012-12-30 18:16:00)  Lisdexamfetamine Oral Capsule What is this medicine? LISDEXAMFETAMINE (lis DEX am fet a meen) is used to treat attention-deficit hyperactivity disorder (ADHD) in adults and children. It is also used to treat binge-eating disorder in adults. Federal law prohibits giving this medicine to any person other than the person for whom it was prescribed. Do not share this medicine with anyone else. This medicine may be used for other purposes; ask your health care provider or pharmacist if you have questions. COMMON BRAND NAME(S): Vyvanse What should I tell my health care provider before I take this medicine? They need to know if you have any of these conditions: -anxiety or panic attacks -circulation problems in fingers and toes -glaucoma -hardening or blockages of the arteries or heart blood vessels -heart disease or a heart defect -high blood pressure -history of a drug or alcohol abuse problem -history of stroke -kidney disease -liver disease -mental illness -seizures -suicidal thoughts, plans, or attempt; a previous suicide attempt by you or a family member -thyroid disease -Tourette's syndrome -an unusual or allergic reaction to lisdexamfetamine, other medicines, foods, dyes, or preservatives -pregnant or trying to get pregnant -breast-feeding How should I use this medicine? Take this medicine by mouth. Follow the directions on the prescription label. Swallow the capsules with a drink of water. You may open capsule and add to a glass of water, then drink right away. Take your doses at regular intervals. Do not take your medicine more often than directed. Do not suddenly stop your medicine. You must gradually reduce the dose or you may feel withdrawal effects. Ask your doctor or health care professional for advice. A special MedGuide will be given  to you by the pharmacist with each prescription and refill. Be sure to read this information carefully each time. Talk to your pediatrician regarding the use of this medicine in children. While this drug may be prescribed for children as young as 20 years of age for selected conditions, precautions do apply. Overdosage: If you think you have taken too much of this medicine contact a poison control center or emergency room at once. NOTE: This medicine is only for you. Do not share this medicine with others. What if I miss a dose? If you miss a dose, take it as soon as you can. If it is almost time for your next dose, take only that dose. Do not take double or extra doses. What may interact with this medicine? Do not take this medicine with any of the following medications: -certain medicines for migraine headache like almotriptan, eletriptan, frovatriptan, naratriptan, rizatriptan, sumatriptan, zolmitriptan -MAOIs like Carbex, Eldepryl, Marplan, Nardil, and Parnate -meperidine -other stimulant medicines for attention disorders, weight loss, or to stay awake -pimozide -procarbazine This medicine may also interact with the following medications: -acetazolamide -ammonium chloride -antacids -ascorbic acid -atomoxetine -caffeine -certain medicines for blood pressure -certain medicines for depression, anxiety, or psychotic disturbances -certain medicines for seizures like carbamazepine, phenobarbital, phenytoin -certain medicines for stomach problems like cimetidine, famotidine, omeprazole, lansoprazole -cold or allergy medicines -green tea -levodopa -linezolid -medicines for sleep during surgery -methenamine -norepinephrine -phenothiazines like chlorpromazine, mesoridazine, prochlorperazine, thioridazine -propoxyphene -sodium acid phosphate -sodium bicarbonate This list may not describe all possible interactions. Give your health care provider a list of all the medicines, herbs,  non-prescription drugs, or dietary  supplements you use. Also tell them if you smoke, drink alcohol, or use illegal drugs. Some items may interact with your medicine. What should I watch for while using this medicine? Visit your doctor for regular check ups. This prescription requires that you follow special procedures with your doctor and pharmacy. You will need to have a new written prescription from your doctor every time you need a refill. This medicine may affect your concentration, or hide signs of tiredness. Until you know how this medicine affects you, do not drive, ride a bicycle, use machinery, or do anything that needs mental alertness. Tell your doctor or health care professional if this medicine loses its effects, or if you feel you need to take more than the prescribed amount. Do not change your dose without talking to your doctor or health care professional. Decreased appetite is a common side effect when starting this medicine. Eating small, frequent meals or snacks can help. Talk to your doctor if you continue to have poor eating habits. Height and weight growth of a child taking this medicine will be monitored closely. Do not take this medicine close to bedtime. It may prevent you from sleeping. If you are going to need surgery, a MRI, CT scan, or other procedure, tell your doctor that you are taking this medicine. You may need to stop taking this medicine before the procedure. Tell your doctor or healthcare professional right away if you notice unexplained wounds on your fingers and toes while taking this medicine. You should also tell your healthcare provider if you experience numbness or pain, changes in the skin color, or sensitivity to temperature in your fingers or toes. What side effects may I notice from receiving this medicine? Side effects that you should report to your doctor or health care professional as soon as possible: -allergic reactions like skin rash, itching or hives,  swelling of the face, lips, or tongue -changes in vision -chest pain or chest tightness -confusion, trouble speaking or understanding -fast, irregular heartbeat -fingers or toes feel numb, cool, painful -hallucination, loss of contact with reality -high blood pressure -males: prolonged or painful erection -seizures -severe headaches -shortness of breath -suicidal thoughts or other mood changes -trouble walking, dizziness, loss of balance or coordination -uncontrollable head, mouth, neck, arm, or leg movements Side effects that usually do not require medical attention (report to your doctor or health care professional if they continue or are bothersome): -anxious -headache -loss of appetite -nausea, vomiting -trouble sleeping -weight loss This list may not describe all possible side effects. Call your doctor for medical advice about side effects. You may report side effects to FDA at 1-800-FDA-1088. Where should I keep my medicine? Keep out of the reach of children. This medicine can be abused. Keep your medicine in a safe place to protect it from theft. Do not share this medicine with anyone. Selling or giving away this medicine is dangerous and against the law. Store at room temperature between 15 and 30 degrees C (59 and 86 degrees F). Protect from light. Keep container tightly closed. Throw away any unused medicine after the expiration date. NOTE: This sheet is a summary. It may not cover all possible information. If you have questions about this medicine, talk to your doctor, pharmacist, or health care provider.  2015, Elsevier/Gold Standard. (2013-04-06 15:41:08)

## 2014-11-23 NOTE — Progress Notes (Signed)
Subjective:    Patient ID: Dana Welch, female    DOB: [birth year], 19 y.o.   MRN: [id number]  Chief Complaint  Patient presents with  . Anxiety  . Establish Care    HPI:  Dana Welch is a 19 y.o. female who  has a past medical history of HSV-1 infection; Dysmenorrhea; ADHD (attention deficit hyperactivity disorder); UTI (lower urinary tract infection); and Anxiety. and presents today for an office visit to establish care.   1.) Anxiety - Previously diagnosed with anxiety and had previously maintained on Lexapro. Notes that she had several side effects from the medication and has weaned herself off of the medication. Notes that her moods have changed for the worse since she stopped taking the medicaiton.   2.) Decreased attention - Previously diagnosed with ADHD as a child and was taking medication through the 6th-7th grade and discontinued the medication because she did not feel like she needed. Currently enrolled at [employer] and working towards her Bachelor's Degree and notes that she has decreased concentration.   No Known Allergies   Outpatient Prescriptions Prior to Visit  Medication Sig Dispense Refill  . norethindrone-ethinyl estradiol (JUNEL FE 1/20) 1-20 MG-MCG tablet Take 1 tablet by mouth daily. 3 Package 3  . escitalopram (LEXAPRO) 10 MG tablet TAKE 1 TAB DAILY THEN 1- 1/2 TABLET 1 WEEK PRIOR TO MENSES 90 tablet 3  . valACYclovir (VALTREX) 500 MG tablet Take 500 mg by mouth daily.     No facility-administered medications prior to visit.     Past Medical History  Diagnosis Date  . HSV-1 infection   . Dysmenorrhea   . ADHD (attention deficit hyperactivity disorder)   . UTI (lower urinary tract infection)   . Anxiety      Past Surgical History  Procedure Laterality Date  . Wisdom tooth extraction       Family History  Problem Relation Age of Onset  . Thyroid disease Mother   . Heart attack Maternal Grandfather   . Rashes / Skin problems Maternal Grandfather     . Cancer Paternal Grandmother     kidney  . Hypertension Father      Social History   Social History  . Marital Status: Single    Spouse Name: N/A  . Number of Children: 0  . Years of Education: 16   Occupational History  . 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 Smoking status: Never Smoker   . Smokeless tobacco: Never Used  . Alcohol Use: Yes     Comment: Rarly   . Drug Use: No  . Sexual Activity: Yes    Birth Control/ Protection: Pill   Other Topics Concern  . Not on file   Social History Narrative   Fun: Exercise, golf, be at home.    Denies abuse and feels safe at home.     Review of Systems  Constitutional: Negative for fever and chills.  Psychiatric/Behavioral: Positive for decreased concentration. Negative for dysphoric mood. The patient is nervous/anxious.       Objective:    BP 110/68 mmHg  Pulse 100  Temp(Src) 98.4 F (36.9 C) (Oral)  Ht 5' 4.5" (1.638 m)  Wt 130 lb (58.968 kg)  BMI 21.98 kg/m2  SpO2 98%  LMP 11/13/2014 Nursing note and vital signs reviewed.  Physical Exam  Constitutional: She is oriented to person, place, and time. She appears well-developed and well-nourished. No distress.  Cardiovascular:  Normal rate, regular rhythm, normal heart sounds and intact distal pulses.   Pulmonary/Chest: Effort normal and breath sounds normal.  Neurological: She is alert and oriented to person, place, and time.  Skin: Skin is warm and dry.  Psychiatric: She has a normal mood and affect. Her behavior is normal. Judgment and thought content normal.       Assessment & Plan:   Problem List Items Addressed This Visit      Other   Generalized anxiety disorder - Primary    Previously maintained on Lexapro and discontinued secondary to side effects, however did not improvement in her anxiety. Given her current situation, recommend daily medication. Start venlafaxine. Follow up in 1 month to determine effectiveness.        Relevant Medications   venlafaxine XR (EFFEXOR-XR) 37.5 MG 24 hr capsule   ADHD (attention deficit hyperactivity disorder)    Previously diagnosed with ADHD as a child and rediagnosed as a teenager. Not currently taking medication. Discussed options of treatment including stimulants, straterra, and burproprion. Will check to see what insurance will cover before starting medication. ADHD may be the underlying reason for her anxiety or may no longer play a role in her current situation. Follow up pending insurance check and trial of venlafaxine.

## 2014-11-23 NOTE — Assessment & Plan Note (Signed)
Previously maintained on Lexapro and discontinued secondary to side effects, however did not improvement in her anxiety. Given her current situation, recommend daily medication. Start venlafaxine. Follow up in 1 month to determine effectiveness.

## 2014-11-23 NOTE — Progress Notes (Signed)
Pre visit review using our clinic review tool, if applicable. No additional management support is needed unless otherwise documented below in the visit note. 

## 2014-11-23 NOTE — Assessment & Plan Note (Signed)
Previously diagnosed with ADHD as a child and rediagnosed as a teenager. Not currently taking medication. Discussed options of treatment including stimulants, straterra, and burproprion. Will check to see what insurance will cover before starting medication. ADHD may be the underlying reason for her anxiety or may no longer play a role in her current situation. Follow up pending insurance check and trial of venlafaxine.

## 2014-11-27 MED ORDER — LISDEXAMFETAMINE DIMESYLATE 30 MG PO CAPS: 30.0000 mg | ORAL_CAPSULE | Freq: Every day | ORAL | Status: DC

## 2014-11-29 LAB — HEMOGLOBIN, FINGERSTICK: Hemoglobin, fingerstick: 12.5 g/dL (ref 12.0–16.0)

## 2014-12-13 ENCOUNTER — Encounter: Payer: Self-pay | Admitting: Family

## 2014-12-17 ENCOUNTER — Telehealth: Payer: Self-pay

## 2014-12-17 NOTE — Telephone Encounter (Signed)
PA for vyvanse 30 mg was done and approved. Pharmacy is aware.

## 2014-12-19 ENCOUNTER — Other Ambulatory Visit: Payer: Self-pay | Admitting: Family

## 2014-12-19 DIAGNOSIS — F411 Generalized anxiety disorder: Secondary | ICD-10-CM

## 2014-12-20 ENCOUNTER — Other Ambulatory Visit: Payer: Self-pay | Admitting: Family

## 2014-12-20 NOTE — Addendum Note (Signed)
Addended by: Deatra JamesBRAND, LUCY M on: 12/20/2014 11:12 AM   Modules accepted: Orders

## 2015-01-01 ENCOUNTER — Encounter: Payer: Self-pay | Admitting: Family

## 2015-01-23 ENCOUNTER — Ambulatory Visit: Payer: BC Managed Care – PPO | Admitting: Family

## 2015-01-23 ENCOUNTER — Telehealth: Payer: Self-pay | Admitting: Family

## 2015-01-23 MED ORDER — VENLAFAXINE HCL ER 37.5 MG PO CP24: ORAL_CAPSULE | ORAL | Status: DC

## 2015-01-23 NOTE — Telephone Encounter (Signed)
Patients mother called in to advise that the patient is too sick to come in. She has a virus and went to the campus doctor yesterday. She refused to use the appt today as an acute.   She states that she needs another motnh of the venlafaxine XR (EFFEXOR-XR) 37.5 MG 24 hr capsule [[id number]][[id number]] sent in. Advised that i would pass the message along.Marland Kitchen..Marland Kitchen

## 2015-01-23 NOTE — Telephone Encounter (Signed)
Medication sent to pharmacy  

## 2015-01-24 ENCOUNTER — Other Ambulatory Visit: Payer: Self-pay | Admitting: Family

## 2015-01-25 ENCOUNTER — Encounter: Payer: Self-pay | Admitting: Family

## 2015-02-14 ENCOUNTER — Ambulatory Visit (INDEPENDENT_AMBULATORY_CARE_PROVIDER_SITE_OTHER): Payer: BC Managed Care – PPO | Admitting: Family

## 2015-02-14 ENCOUNTER — Encounter: Payer: Self-pay | Admitting: Family

## 2015-02-14 VITALS — BP 132/78 | HR 104 | Temp 98.4°F | Resp 18 | Ht 64.5 in | Wt 130.4 lb

## 2015-02-14 DIAGNOSIS — F411 Generalized anxiety disorder: Secondary | ICD-10-CM | POA: Diagnosis not present

## 2015-02-14 DIAGNOSIS — Z23 Encounter for immunization: Secondary | ICD-10-CM

## 2015-02-14 MED ORDER — VENLAFAXINE HCL ER 37.5 MG PO CP24: ORAL_CAPSULE | ORAL | Status: DC

## 2015-02-14 NOTE — Patient Instructions (Signed)
Thank you for choosing [facility].  Summary/Instructions:  Please continue to take your medication as prescribed.   Your prescription(s) have been submitted to your pharmacy or been printed and provided for you. Please take as directed and contact our office if you believe you are having problem(s) with the medication(s) or have any questions.  If your symptoms worsen or fail to improve, please contact our office for further instruction, or in case of emergency go directly to the emergency room at the closest medical facility.     

## 2015-02-14 NOTE — Progress Notes (Signed)
   Subjective:    Patient ID: Dana Welch, female    DOB: 11/02/1995, 19 y.o.   MRN: [id number]  Chief Complaint  Patient presents with  . Medication Refill    effexor    HPI:  Dana Welch is a [age] y.o. female who  has a past medical history of HSV-1 infection; Dysmenorrhea; ADHD (attention deficit hyperactivity disorder); UTI (lower urinary tract infection); and Anxiety. and presents today for a follow up office visit.   1.) Generalized anxiety disorder - Currently maintained on the venlafaxine. Takes the edication as prescribed and denies adverse side effects. She does have the occasional upset stomach that has gotten a little better. She reports that her anxiety is well controlled otherwise.   Wt Readings from Last 3 Encounters:  02/14/15 130 lb 6.4 oz (59.149 kg)  11/23/14 130 lb (58.968 kg)  08/08/14 130 lb (58.968 kg)    No Known Allergies   Current Outpatient Prescriptions on File Prior to Visit  Medication Sig Dispense Refill  . norethindrone-ethinyl estradiol (JUNEL FE 1/20) 1-20 MG-MCG tablet Take 1 tablet by mouth daily. 3 Package 3   No current facility-administered medications on file prior to visit.     Review of Systems  Constitutional: Negative for fever and chills.  Psychiatric/Behavioral: Negative for suicidal ideas, sleep disturbance and decreased concentration.      Objective:    BP 132/78 mmHg  Pulse 104  Temp(Src) 98.4 F (36.9 C) (Oral)  Resp 18  Ht 5' 4.5" (1.638 m)  Wt 130 lb 6.4 oz (59.149 kg)  BMI 22.05 kg/m2  SpO2 98% Nursing note and vital signs reviewed.  Physical Exam  Constitutional: She is oriented to person, place, and time. She appears well-developed and well-nourished. No distress.  Cardiovascular: Normal rate, regular rhythm, normal heart sounds and intact distal pulses.   Pulmonary/Chest: Effort normal and breath sounds normal.  Neurological: She is alert and oriented to person, place, and time.  Skin: Skin is warm and dry.   Psychiatric: She has a normal mood and affect. Her behavior is normal. Judgment and thought content normal.       Assessment & Plan:   Problem List Items Addressed This Visit      Other   Generalized anxiety disorder - Primary    Anxiety is adequately controlled with current medication with no major adverse side effects. Continue current dosage of venlafaxine. Follow up in 6 months or sooner if needed.       Relevant Medications   venlafaxine XR (EFFEXOR-XR) 37.5 MG 24 hr capsule

## 2015-02-14 NOTE — Progress Notes (Signed)
Pre visit review using our clinic review tool, if applicable. No additional management support is needed unless otherwise documented below in the visit note. 

## 2015-02-14 NOTE — Assessment & Plan Note (Signed)
Anxiety is adequately controlled with current medication with no major adverse side effects. Continue current dosage of venlafaxine. Follow up in 6 months or sooner if needed.

## 2015-03-06 ENCOUNTER — Telehealth: Payer: Self-pay | Admitting: Nurse Practitioner

## 2015-03-06 NOTE — Telephone Encounter (Signed)
Spoke with patient. Patient states that she has been taking Junel Fe for "years." Over the last four months her periods have become very light. "I have bleeding that is almost nothing at all. It is very light. I am also having some spotting for around 1 hour two times a month." Denies missing any pills or taking any pills late. Denies any concerns for pregnancy. "If I ever miss a pill I make sure I use a back up method. I have not missed any pills in a while." Advised patient that having light bleeding during her cycle is not uncommon with birth control and this is normal. Advised BTB is also normal with OCP. Advised if this is concerning or bothersome I could speak with Ria CommentPatricia Grubb, FNP to see if there are alternative OCP's she could try to reduce BTB. Patient states the BTB is so light it is not bothersome. "I just want to make sure it is normal." Advised this is not concerning as long as she is taking her OCP as directed and has not missed any pills. Patient again states she has not missed any pills and takes her pill at the same time daily. Advised it is okay to monitor. If BTB increases or becomes bothersome she may contact the office to discuss alternative medications. Patient is agreeable.  Routing to provider for final review. Patient agreeable to disposition. Will close encounter.

## 2015-03-06 NOTE — Telephone Encounter (Signed)
Patient has some questions about Junel FE BC. Best # to reach: [phone]

## 2015-04-18 ENCOUNTER — Other Ambulatory Visit: Payer: Self-pay | Admitting: Family

## 2015-05-25 ENCOUNTER — Other Ambulatory Visit: Payer: Self-pay | Admitting: Family

## 2015-07-03 ENCOUNTER — Other Ambulatory Visit: Payer: Self-pay | Admitting: Family

## 2015-07-17 ENCOUNTER — Other Ambulatory Visit: Payer: Self-pay | Admitting: *Deleted

## 2015-07-17 MED ORDER — NORETHIN ACE-ETH ESTRAD-FE 1-20 MG-MCG PO TABS: 1.0000 | ORAL_TABLET | Freq: Every day | ORAL | Status: DC

## 2015-07-17 NOTE — Telephone Encounter (Signed)
Medication refill request: Junel 1/20 mcg  Last AEX:  08/08/2014 with PG  Next AEX: 08/13/2015 with PG  Last MMG (if hormonal medication request): n/a Refill authorized: #3 packs

## 2015-08-04 ENCOUNTER — Other Ambulatory Visit: Payer: Self-pay | Admitting: Family

## 2015-08-12 ENCOUNTER — Ambulatory Visit: Payer: BC Managed Care – PPO | Admitting: Nurse Practitioner

## 2015-08-13 ENCOUNTER — Ambulatory Visit: Payer: BC Managed Care – PPO | Admitting: Nurse Practitioner

## 2015-08-16 ENCOUNTER — Ambulatory Visit: Payer: BC Managed Care – PPO | Admitting: Nurse Practitioner

## 2015-08-21 ENCOUNTER — Ambulatory Visit (INDEPENDENT_AMBULATORY_CARE_PROVIDER_SITE_OTHER)
Admission: RE | Admit: 2015-08-21 | Discharge: 2015-08-21 | Disposition: A | Discharge status: Home / Self Care | Payer: BC Managed Care – PPO | Source: Ambulatory Visit | Attending: Family | Admitting: Family

## 2015-08-21 ENCOUNTER — Encounter: Payer: Self-pay | Admitting: Family

## 2015-08-21 ENCOUNTER — Ambulatory Visit (INDEPENDENT_AMBULATORY_CARE_PROVIDER_SITE_OTHER): Payer: BC Managed Care – PPO | Admitting: Family

## 2015-08-21 VITALS — BP 122/80 | HR 108 | Temp 98.5°F | Resp 16 | Ht 64.5 in | Wt 136.0 lb

## 2015-08-21 DIAGNOSIS — R05 Cough: Secondary | ICD-10-CM | POA: Diagnosis not present

## 2015-08-21 DIAGNOSIS — R059 Cough, unspecified: Secondary | ICD-10-CM

## 2015-08-21 MED ORDER — LEVOFLOXACIN 500 MG PO TABS: 500.0000 mg | ORAL_TABLET | Freq: Every day | ORAL | Status: DC

## 2015-08-21 NOTE — Assessment & Plan Note (Signed)
[facility] with concern for possible tuberculosis given questionable TB skin test. Given symptoms, this is unlikely. Obtain chest x-ray for confirmation. Symptoms appear more consistent with resolving sinus infection not completely resolved with azithromycin. Start levofloxacin. Continue over-the-counter medications as needed for symptom relief and supportive care. Follow-up pending chest x-ray or symptoms worsen or do not improve.

## 2015-08-21 NOTE — Patient Instructions (Signed)
Thank you for choosing [facility].  Summary/Instructions:  Your prescription(s) have been submitted to your pharmacy or been printed and provided for you. Please take as directed and contact our office if you believe you are having problem(s) with the medication(s) or have any questions.  Please stop by radiology on the basement level of the building for your x-rays. Your results will be released to MyChart (or called to you) after review, usually within 72 hours after test completion. If any treatments or changes are necessary, you will be notified at that same time.  If your symptoms worsen or fail to improve, please contact our office for further instruction, or in case of emergency go directly to the emergency room at the closest medical facility.   Sinusitis, Adult Sinusitis is redness, soreness, and inflammation of the paranasal sinuses. Paranasal sinuses are air pockets within the bones of your face. They are located beneath your eyes, in the middle of your forehead, and above your eyes. In healthy paranasal sinuses, mucus is able to drain out, and air is able to circulate through them by way of your nose. However, when your paranasal sinuses are inflamed, mucus and air can become trapped. This can allow bacteria and other germs to grow and cause infection. Sinusitis can develop quickly and last only a short time (acute) or continue over a long period (chronic). Sinusitis that lasts for more than 12 weeks is considered chronic. CAUSES Causes of sinusitis include:  Allergies.  Structural abnormalities, such as displacement of the cartilage that separates your nostrils (deviated septum), which can decrease the air flow through your nose and sinuses and affect sinus drainage.  Functional abnormalities, such as when the small hairs (cilia) that line your sinuses and help remove mucus do not work properly or are not present. SIGNS AND SYMPTOMS Symptoms of acute and chronic sinusitis  are the same. The primary symptoms are pain and pressure around the affected sinuses. Other symptoms include:  Upper toothache.  Earache.  Headache.  Bad breath.  Decreased sense of smell and taste.  A cough, which worsens when you are lying flat.  Fatigue.  Fever.  Thick drainage from your nose, which often is green and may contain pus (purulent).  Swelling and warmth over the affected sinuses. DIAGNOSIS Your health care provider will perform a physical exam. During your exam, your health care provider may perform any of the following to help determine if you have acute sinusitis or chronic sinusitis:  Look in your nose for signs of abnormal growths in your nostrils (nasal polyps).  Tap over the affected sinus to check for signs of infection.  View the inside of your sinuses using an imaging device that has a light attached (endoscope). If your health care provider suspects that you have chronic sinusitis, one or more of the following tests may be recommended:  Allergy tests.  Nasal culture. A sample of mucus is taken from your nose, sent to a lab, and screened for bacteria.  Nasal cytology. A sample of mucus is taken from your nose and examined by your health care provider to determine if your sinusitis is related to an allergy. TREATMENT Most cases of acute sinusitis are related to a viral infection and will resolve on their own within 10 days. Sometimes, medicines are prescribed to help relieve symptoms of both acute and chronic sinusitis. These may include pain medicines, decongestants, nasal steroid sprays, or saline sprays. However, for sinusitis related to a bacterial infection, your health care provider  will prescribe antibiotic medicines. These are medicines that will help kill the bacteria causing the infection. Rarely, sinusitis is caused by a fungal infection. In these cases, your health care provider will prescribe antifungal medicine. For some cases of chronic  sinusitis, surgery is needed. Generally, these are cases in which sinusitis recurs more than 3 times per year, despite other treatments. HOME CARE INSTRUCTIONS  Drink plenty of water. Water helps thin the mucus so your sinuses can drain more easily.  Use a humidifier.  Inhale steam 3-4 times a day (for example, sit in the bathroom with the shower running).  Apply a warm, moist washcloth to your face 3-4 times a day, or as directed by your health care provider.  Use saline nasal sprays to help moisten and clean your sinuses.  Take medicines only as directed by your health care provider.  If you were prescribed either an antibiotic or antifungal medicine, finish it all even if you start to feel better. SEEK IMMEDIATE MEDICAL CARE IF:  You have increasing pain or severe headaches.  You have nausea, vomiting, or drowsiness.  You have swelling around your face.  You have vision problems.  You have a stiff neck.  You have difficulty breathing.   This information is not intended to replace advice given to you by your health care provider. Make sure you discuss any questions you have with your health care provider.   Document Released: 02/16/2005 Document Revised: 03/09/2014 Document Reviewed: 03/03/2011 Elsevier Interactive Patient Education [employer]2016 Elsevier Inc.

## 2015-08-21 NOTE — Progress Notes (Signed)
Subjective:    Patient ID: Dana Welch, female    DOB: 12/09/1995, 20 y.o.   MRN: [id number]  Chief Complaint  Patient presents with  . Cough    x1 month has had a productive cough, no other sxs associated with the cough    HPI:  Dana Welch is a 20 y.o. female who  has a past medical history of HSV-1 infection; Dysmenorrhea; ADHD (attention deficit hyperactivity disorder); UTI (lower urinary tract infection); and Anxiety. and presents today for an acute office visit.   This is a new problem. Associated symptom of a productive cough has been going on for about 1 month. Denies fevers. Modifying factors include a azithromycin which she received while in school which has not helped very much. Timing of the symptoms is generally mid-day. Reports a sinus infection about 3 weeks ago. Denies fevers. Course of the symptoms has generally stayed about the same.  No Known Allergies   Current Outpatient Prescriptions on File Prior to Visit  Medication Sig Dispense Refill  . norethindrone-ethinyl estradiol (JUNEL FE 1/20) 1-20 MG-MCG tablet Take 1 tablet by mouth daily. 3 Package 0  . venlafaxine XR (EFFEXOR-XR) 37.5 MG 24 hr capsule TAKE 1 CAPSULE BY MOUTH DAILY WITH BREAKFAST 30 capsule 0   No current facility-administered medications on file prior to visit.    Review of Systems  Constitutional: Negative for fever and chills.  HENT: Positive for congestion and sinus pressure. Negative for ear pain, facial swelling and sore throat.   Respiratory: Positive for cough. Negative for chest tightness, shortness of breath and wheezing.   Neurological: Negative for headaches.      Objective:    BP 122/80 mmHg  Pulse 108  Temp(Src) 98.5 F (36.9 C) (Oral)  Resp 16  Ht 5' 4.5" (1.638 m)  Wt 136 lb (61.689 kg)  BMI 22.99 kg/m2  SpO2 95%  LMP 08/21/2015 Nursing note and vital signs reviewed.  Physical Exam  Constitutional: She is oriented to person, place, and time. She appears  well-developed and well-nourished. No distress.  HENT:  Right Ear: Hearing, tympanic membrane, external ear and ear canal normal.  Left Ear: Hearing, tympanic membrane, external ear and ear canal normal.  Nose: Nose normal.  Mouth/Throat: Uvula is midline, oropharynx is clear and moist and mucous membranes are normal.  Cardiovascular: Normal rate, regular rhythm, normal heart sounds and intact distal pulses.   Pulmonary/Chest: Effort normal and breath sounds normal.  Neurological: She is alert and oriented to person, place, and time.  Skin: Skin is warm and dry.  Psychiatric: She has a normal mood and affect. Her behavior is normal. Judgment and thought content normal.       Assessment & Plan:   Problem List Items Addressed This Visit      Other   Cough - Primary    [facility] with concern for possible tuberculosis given questionable TB skin test. Given symptoms, this is unlikely. Obtain chest x-ray for confirmation. Symptoms appear more consistent with resolving sinus infection not completely resolved with azithromycin. Start levofloxacin. Continue over-the-counter medications as needed for symptom relief and supportive care. Follow-up pending chest x-ray or symptoms worsen or do not improve.      Relevant Medications   levofloxacin (LEVAQUIN) 500 MG tablet   Other Relevant Orders   DG Chest 2 View       I am having Ms. Welch start on levofloxacin. I am also having her maintain her norethindrone-ethinyl estradiol and venlafaxine  XR.   Meds ordered this encounter  Medications  . levofloxacin (LEVAQUIN) 500 MG tablet    Sig: Take 1 tablet (500 mg total) by mouth daily.    Dispense:  7 tablet    Refill:  0    Order Specific Question:  Supervising Provider    Answer:  Hillard Danker A [[id number]]     Follow-up: No Follow-up on file.  Jeanine Luz, FNP

## 2015-08-28 ENCOUNTER — Encounter: Payer: Self-pay | Admitting: Nurse Practitioner

## 2015-08-28 ENCOUNTER — Ambulatory Visit (INDEPENDENT_AMBULATORY_CARE_PROVIDER_SITE_OTHER): Payer: BC Managed Care – PPO | Admitting: Nurse Practitioner

## 2015-08-28 VITALS — BP 130/76 | HR 84 | Ht 64.75 in | Wt 135.0 lb

## 2015-08-28 DIAGNOSIS — Z0000 Encounter for general adult medical examination without abnormal findings: Secondary | ICD-10-CM | POA: Diagnosis not present

## 2015-08-28 DIAGNOSIS — Z01419 Encounter for gynecological examination (general) (routine) without abnormal findings: Secondary | ICD-10-CM

## 2015-08-28 LAB — POCT URINALYSIS DIPSTICK
Bilirubin, UA: NEGATIVE
Blood, UA: NEGATIVE
Glucose, UA: NEGATIVE
Ketones, UA: NEGATIVE
LEUKOCYTES UA: NEGATIVE
NITRITE UA: NEGATIVE
PH UA: 6
PROTEIN UA: NEGATIVE
Urobilinogen, UA: NEGATIVE

## 2015-08-28 LAB — HEMOGLOBIN, FINGERSTICK: HEMOGLOBIN, FINGERSTICK: 13.5 g/dL (ref 12.0–16.0)

## 2015-08-28 MED ORDER — NORETHIN ACE-ETH ESTRAD-FE 1-20 MG-MCG PO TABS: 1.0000 | ORAL_TABLET | Freq: Every day | ORAL | Status: DC

## 2015-08-28 NOTE — Progress Notes (Signed)
Patient ID: Dana HeraldCari E Welch, female   DOB: 11/30/1995, 20 y.o.   MRN: [id number]  20 y.o. G0P0000 Single  Caucasian Fe here for annual exam.  Menses now at 4-5 days.  Moderate to light alternating with just light. Some cramps.  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 Her boyfriend is now helping out with family business.  Patient's last menstrual period was 08/21/2015 (exact date).          Sexually active: Yes.    The current method of family planning is OCP (estrogen/progesterone) and condoms sometimes.  Condom use with missed/late pills only. Same partner x 5.5 years. Exercising: patient is active, but does not do any organized exercise. Smoker:  no  Health Maintenance: Pap:  None per guidelines TDaP:  2010 HIV: drawn today Labs: HB: 13.5  Urine: Negative    reports that she has never smoked. She has never used smokeless tobacco. She reports that she drinks alcohol. She reports that she does not use illicit drugs.  Past Medical History  Diagnosis Date  . HSV-1 infection   . Dysmenorrhea   . ADHD (attention deficit hyperactivity disorder)   . UTI (lower urinary tract infection)   . Anxiety     Past Surgical History  Procedure Laterality Date  . Wisdom tooth extraction      Current Outpatient Prescriptions  Medication Sig Dispense Refill  . norethindrone-ethinyl estradiol (JUNEL FE 1/20) 1-20 MG-MCG tablet Take 1 tablet by mouth daily. 3 Package 4  . venlafaxine XR (EFFEXOR-XR) 37.5 MG 24 hr capsule TAKE 1 CAPSULE BY MOUTH DAILY WITH BREAKFAST 30 capsule 0   No current facility-administered medications for this visit.    Family History  Problem Relation Age of Onset  . Thyroid disease Mother   . Heart attack Maternal Grandfather   . Rashes / Skin problems Maternal Grandfather   . Cancer Paternal Grandmother     kidney  . Hypertension Father     ROS:  Pertinent items are noted  in HPI.  Otherwise, a comprehensive ROS was negative.  Exam:   BP 130/76 mmHg  Pulse 84  Ht 5' 4.75" (1.645 m)  Wt 135 lb (61.236 kg)  BMI 22.63 kg/m2  LMP 08/21/2015 (Exact Date) Height: 5' 4.75" (164.5 cm) Ht Readings from Last 3 Encounters:  08/28/15 5' 4.75" (1.645 m)  08/21/15 5' 4.5" (1.638 m)  02/14/15 5' 4.5" (1.638 m)    General appearance: alert, cooperative and appears stated age Head: Normocephalic, without obvious abnormality, atraumatic Neck: no adenopathy, supple, symmetrical, trachea midline and thyroid normal to inspection and palpation Lungs: clear to auscultation bilaterally Breasts: normal appearance, no masses or tenderness Heart: regular rate and rhythm Abdomen: soft, non-tender; no masses,  no organomegaly Extremities: extremities normal, atraumatic, no cyanosis or edema Skin: Skin color, texture, turgor normal. No rashes or lesions Lymph nodes: Cervical, supraclavicular, and axillary nodes normal. No abnormal inguinal nodes palpated Neurologic: Grossly normal   Pelvic: External genitalia:  no lesions              Urethra:  normal appearing urethra with no masses, tenderness or lesions              Bartholin's and Skene's: normal                 Vagina: normal appearing vagina with normal color and discharge, no lesions  Cervix: anteverted              Pap taken: Yes.   Bimanual Exam:  Uterus:  normal size, contour, position, consistency, mobility, non-tender              Adnexa: no mass, fullness, tenderness on bimanual exam I felt like there was an internal vaginal 'band'; looked again with speculum and no abnormality. After rechecking bimanually there was a relaxation and did not feel as tight.  Most likely a vaginal spasm from collecting the pap or speculum insertion.               Rectovaginal: Confirms               Anus:  normal sphincter tone, no lesions  Chaperone present: yes  A:  Well Woman with normal  exam  Contraception History of HSV I culture proven genital 2013 Generalized anxiety disorder - doing well on med's  P:   Reviewed health and wellness pertinent to exam  Pap smear as above  Refill on OCP for a year  Follow with labs  Counseled on breast self exam, STD prevention, HIV risk factors and prevention, use and side effects of OCP's, adequate intake of calcium and vitamin D, diet and exercise return annually or prn  An After Visit Summary was printed and given to the patient.

## 2015-08-28 NOTE — Patient Instructions (Signed)
General topics  Next pap or exam is  due in 1 year Take a Women's multivitamin Take 1200 mg. of calcium daily - prefer dietary If any concerns in interim to call back  Breast Self-Awareness Practicing breast self-awareness may pick up problems early, prevent significant medical complications, and possibly save your life. By practicing breast self-awareness, you can become familiar with how your breasts look and feel and if your breasts are changing. This allows you to notice changes early. It can also offer you some reassurance that your breast health is good. One way to learn what is normal for your breasts and whether your breasts are changing is to do a breast self-exam. If you find a lump or something that was not present in the past, it is best to contact your caregiver right away. Other findings that should be evaluated by your caregiver include nipple discharge, especially if it is bloody; skin changes or reddening; areas where the skin seems to be pulled in (retracted); or new lumps and bumps. Breast pain is seldom associated with cancer (malignancy), but should also be evaluated by a caregiver. BREAST SELF-EXAM The best time to examine your breasts is 5 7 days after your menstrual period is over.  ExitCare Patient Information 2013 [employer].   Exercise to Stay Healthy Exercise helps you become and stay healthy. EXERCISE IDEAS AND TIPS Choose exercises that:  You enjoy.  Fit into your day. You do not need to exercise really hard to be healthy. You can do exercises at a slow or medium level and stay healthy. You can:  Stretch before and after working out.  Try yoga, Pilates, or tai chi.  Lift weights.  Walk fast, swim, jog, run, climb stairs, bicycle, dance, or rollerskate.  Take aerobic classes. Exercises that burn about 150 calories:  Running 1  miles in 15 minutes.  Playing volleyball for 45 to 60 minutes.  Washing and waxing a car for 45 to 60  minutes.  Playing touch football for 45 minutes.  Walking 1  miles in 35 minutes.  Pushing a stroller 1  miles in 30 minutes.  Playing basketball for 30 minutes.  Raking leaves for 30 minutes.  Bicycling 5 miles in 30 minutes.  Walking 2 miles in 30 minutes.  Dancing for 30 minutes.  Shoveling snow for 15 minutes.  Swimming laps for 20 minutes.  Walking up stairs for 15 minutes.  Bicycling 4 miles in 15 minutes.  Gardening for 30 to 45 minutes.  Jumping rope for 15 minutes.  Washing windows or floors for 45 to 60 minutes. Document Released: 03/21/2010 Document Revised: 05/11/2011 Document Reviewed: 03/21/2010 ExitCare Patient Information 2013 [employer]Care[employer].   Other topics ( that may be useful information):    Sexually Transmitted Disease Sexually transmitted disease (STD) refers to any infection that is passed from person to person during sexual activity. This may happen by way of saliva, semen, blood, vaginal mucus, or urine. Common STDs include:  Gonorrhea.  Chlamydia.  Syphilis.  HIV/AIDS.  Genital herpes.  Hepatitis B and C.  Trichomonas.  Human papillomavirus (HPV).  Pubic lice. CAUSES  An STD may be spread by bacteria, virus, or parasite. A person can get an STD by:  Sexual intercourse with an infected person.  Sharing sex toys with an infected person.  Sharing needles with an infected person.  Having intimate contact with the genitals, mouth, or rectal areas of an infected person. SYMPTOMS  Some people may not have any symptoms, but   they can still pass the infection to others. Different STDs have different symptoms. Symptoms include:  Painful or bloody urination.  Pain in the pelvis, abdomen, vagina, anus, throat, or eyes.  Skin rash, itching, irritation, growths, or sores (lesions). These usually occur in the genital or anal area.  Abnormal vaginal discharge.  Penile discharge in men.  Soft, flesh-colored skin growths in the  genital or anal area.  Fever.  Pain or bleeding during sexual intercourse.  Swollen glands in the groin area.  Yellow skin and eyes (jaundice). This is seen with hepatitis. DIAGNOSIS  To make a diagnosis, your caregiver may:  Take a medical history.  Perform a physical exam.  Take a specimen (culture) to be examined.  Examine a sample of discharge under a microscope.  Perform blood test TREATMENT   Chlamydia, gonorrhea, trichomonas, and syphilis can be cured with antibiotic medicine.  Genital herpes, hepatitis, and HIV can be treated, but not cured, with prescribed medicines. The medicines will lessen the symptoms.  Genital warts from HPV can be treated with medicine or by freezing, burning (electrocautery), or surgery. Warts may come back.  HPV is a virus and cannot be cured with medicine or surgery.However, abnormal areas may be followed very closely by your caregiver and may be removed from the cervix, vagina, or vulva through office procedures or surgery. If your diagnosis is confirmed, your recent sexual partners need treatment. This is true even if they are symptom-free or have a negative culture or evaluation. They should not have sex until their caregiver says it is okay. HOME CARE INSTRUCTIONS  All sexual partners should be informed, tested, and treated for all STDs.  Take your antibiotics as directed. Finish them even if you start to feel better.  Only take over-the-counter or prescription medicines for pain, discomfort, or fever as directed by your caregiver.  Rest.  Eat a balanced diet and drink enough fluids to keep your urine clear or pale yellow.  Do not have sex until treatment is completed and you have followed up with your caregiver. STDs should be checked after treatment.  Keep all follow-up appointments, Pap tests, and blood tests as directed by your caregiver.  Only use latex condoms and water-soluble lubricants during sexual activity. Do not use  petroleum jelly or oils.  Avoid alcohol and illegal drugs.  Get vaccinated for HPV and hepatitis. If you have not received these vaccines in the past, talk to your caregiver about whether one or both might be right for you.  Avoid risky sex practices that can break the skin. The only way to avoid getting an STD is to avoid all sexual activity.Latex condoms and dental dams (for oral sex) will help lessen the risk of getting an STD, but will not completely eliminate the risk. SEEK MEDICAL CARE IF:   You have a fever.  You have any new or worsening symptoms. Document Released: 05/09/2002 Document Revised: 05/11/2011 Document Reviewed: 05/16/2010 [facility] Patient Information 2013 [place].    Domestic Abuse You are being battered or abused if someone close to you hits, pushes, or physically hurts you in any way. You also are being abused if you are forced into activities. You are being sexually abused if you are forced to have sexual contact of any kind. You are being emotionally abused if you are made to feel worthless or if you are constantly threatened. It is important to remember that help is available. No one has the right to abuse you. PREVENTION OF FURTHER  ABUSE  Learn the warning signs of danger. This varies with situations but may include: the use of alcohol, threats, isolation from friends and family, or forced sexual contact. Leave if you feel that violence is going to occur.  If you are attacked or beaten, report it to the police so the abuse is documented. You do not have to press charges. The police can protect you while you or the attackers are leaving. Get the officer's name and badge number and a copy of the report.  Find someone you can trust and tell them what is happening to you: your caregiver, a nurse, clergy member, close friend or family member. Feeling ashamed is natural, but remember that you have done nothing wrong. No one deserves abuse. Document Released:  02/14/2000 Document Revised: 05/11/2011 Document Reviewed: 04/24/2010 ExitCare Patient Information 2013 [employer].    How Much is Too Much Alcohol? Drinking too much alcohol can cause injury, accidents, and health problems. These types of problems can include:   Car crashes.  Falls.  Family fighting (domestic violence).  Drowning.  Fights.  Injuries.  Burns.  Damage to certain organs.  Having a baby with birth defects. ONE DRINK CAN BE TOO MUCH WHEN YOU ARE:  Working.  Pregnant or breastfeeding.  Taking medicines. Ask your doctor.  Driving or planning to drive. If you or someone you know has a drinking problem, get help from a doctor.  Document Released: 12/13/2008 Document Revised: 05/11/2011 Document Reviewed: 12/13/2008 ExitCare Patient Information 2013 [employer].   Smoking Hazards Smoking cigarettes is extremely bad for your health. Tobacco smoke has over 200 known poisons in it. There are over 60 chemicals in tobacco smoke that cause cancer. Some of the chemicals found in cigarette smoke include:   Cyanide.  Benzene.  Formaldehyde.  Methanol (wood alcohol).  Acetylene (fuel used in welding torches).  Ammonia. Cigarette smoke also contains the poisonous gases nitrogen oxide and carbon monoxide.  Cigarette smokers have an increased risk of many serious medical problems and Smoking causes approximately:  90% of all lung cancer deaths in men.  80% of all lung cancer deaths in women.  90% of deaths from chronic obstructive lung disease. Compared with nonsmokers, smoking increases the risk of:  Coronary heart disease by 2 to 4 times.  Stroke by 2 to 4 times.  Men developing lung cancer by 23 times.  Women developing lung cancer by 13 times.  Dying from chronic obstructive lung diseases by 12 times.  . Smoking is the most preventable cause of death and disease in our society.  WHY IS SMOKING ADDICTIVE?  Nicotine is the chemical  agent in tobacco that is capable of causing addiction or dependence.  When you smoke and inhale, nicotine is absorbed rapidly into the bloodstream through your lungs. Nicotine absorbed through the lungs is capable of creating a powerful addiction. Both inhaled and non-inhaled nicotine may be addictive.  Addiction studies of cigarettes and spit tobacco show that addiction to nicotine occurs mainly during the teen years, when young people begin using tobacco products. WHAT ARE THE BENEFITS OF QUITTING?  There are many health benefits to quitting smoking.   Likelihood of developing cancer and heart disease decreases. Health improvements are seen almost immediately.  Blood pressure, pulse rate, and breathing patterns start returning to normal soon after quitting. QUITTING SMOKING   American Lung Association - 1-800-LUNGUSA  American Cancer Society - 1-800-ACS-2345 Document Released: 03/26/2004 Document Revised: 05/11/2011 Document Reviewed: 11/28/2008 ExitCare Patient Information 2013 ExitCare,   LLC.   Stress Management Stress is a state of physical or mental tension that often results from changes in your life or normal routine. Some common causes of stress are:  Death of a loved one.  Injuries or severe illnesses.  Getting fired or changing jobs.  Moving into a new home. Other causes may be:  Sexual problems.  Business or financial losses.  Taking on a large debt.  Regular conflict with someone at home or at work.  Constant tiredness from lack of sleep. It is not just bad things that are stressful. It may be stressful to:  Win the lottery.  Get married.  Buy a new car. The amount of stress that can be easily tolerated varies from person to person. Changes generally cause stress, regardless of the types of change. Too much stress can affect your health. It may lead to physical or emotional problems. Too little stress (boredom) may also become stressful. SUGGESTIONS TO  REDUCE STRESS:  Talk things over with your family and friends. It often is helpful to share your concerns and worries. If you feel your problem is serious, you may want to get help from a professional counselor.  Consider your problems one at a time instead of lumping them all together. Trying to take care of everything at once may seem impossible. List all the things you need to do and then start with the most important one. Set a goal to accomplish 2 or 3 things each day. If you expect to do too many in a single day you will naturally fail, causing you to feel even more stressed.  Do not use alcohol or drugs to relieve stress. Although you may feel better for a short time, they do not remove the problems that caused the stress. They can also be habit forming.  Exercise regularly - at least 3 times per week. Physical exercise can help to relieve that "uptight" feeling and will relax you.  The shortest distance between despair and hope is often a good night's sleep.  Go to bed and get up on time allowing yourself time for appointments without being rushed.  Take a short "time-out" period from any stressful situation that occurs during the day. Close your eyes and take some deep breaths. Starting with the muscles in your face, tense them, hold it for a few seconds, then relax. Repeat this with the muscles in your neck, shoulders, hand, stomach, back and legs.  Take good care of yourself. Eat a balanced diet and get plenty of rest.  Schedule time for having fun. Take a break from your daily routine to relax. HOME CARE INSTRUCTIONS   Call if you feel overwhelmed by your problems and feel you can no longer manage them on your own.  Return immediately if you feel like hurting yourself or someone else. Document Released: 08/12/2000 Document Revised: 05/11/2011 Document Reviewed: 04/04/2007 ExitCare Patient Information 2013 [employer].  

## 2015-08-28 NOTE — Progress Notes (Signed)
Encounter reviewed by Dr. Brook Amundson C. Silva.  

## 2015-08-29 LAB — STD PANEL
HEP B S AG: NEGATIVE
HIV 1&2 Ab, 4th Generation: NONREACTIVE

## 2015-08-29 LAB — IPS PAP TEST WITH REFLEX TO HPV

## 2015-08-30 LAB — IPS N GONORRHOEA AND CHLAMYDIA BY PCR

## 2015-09-04 ENCOUNTER — Other Ambulatory Visit: Payer: Self-pay | Admitting: Family

## 2015-10-08 ENCOUNTER — Other Ambulatory Visit: Payer: Self-pay | Admitting: Family

## 2015-10-10 ENCOUNTER — Other Ambulatory Visit: Payer: Self-pay | Admitting: Nurse Practitioner

## 2015-11-08 ENCOUNTER — Other Ambulatory Visit: Payer: Self-pay | Admitting: *Deleted

## 2015-11-08 MED ORDER — VENLAFAXINE HCL ER 37.5 MG PO CP24: ORAL_CAPSULE | ORAL | 0 refills | Status: DC

## 2015-11-17 ENCOUNTER — Encounter: Payer: Self-pay | Admitting: Nurse Practitioner

## 2015-11-17 ENCOUNTER — Other Ambulatory Visit: Payer: Self-pay | Admitting: Obstetrics & Gynecology

## 2015-11-17 MED ORDER — VALACYCLOVIR HCL 500 MG PO TABS: 500.0000 mg | ORAL_TABLET | Freq: Two times a day (BID) | ORAL | 1 refills | Status: DC

## 2015-11-17 NOTE — Progress Notes (Signed)
Rx sent to pharmacy in response to [employer] message.

## 2016-02-10 ENCOUNTER — Other Ambulatory Visit: Payer: Self-pay | Admitting: Nurse Practitioner

## 2016-02-10 MED ORDER — NORETHIN ACE-ETH ESTRAD-FE 1-20 MG-MCG PO TABS: 1.0000 | ORAL_TABLET | Freq: Every day | ORAL | 0 refills | Status: DC

## 2016-02-10 NOTE — Telephone Encounter (Signed)
Patient is home for Christmas and has [facility] [place] [facility]. She is requesting a one pack to get her thru the holiday. [facility] is using [facility] [address].

## 2016-02-10 NOTE — Telephone Encounter (Signed)
Medication refill request: OCP Last AEX:  08-28-15  Next AEX: [phone] Last MMG (if hormonal medication request): N/A Refill authorized: Patient left her OCP back at college. She is requesting a 1 month refill to last her till she starts back to school in January

## 2016-03-06 ENCOUNTER — Other Ambulatory Visit: Payer: Self-pay | Admitting: Nurse Practitioner

## 2016-03-06 NOTE — Telephone Encounter (Signed)
Medication refill request: Norethindrone-Ethinyl Estradiol Last AEX:  08/28/15 PG Next AEX: 08/28/16 PG Last MMG (if hormonal medication request): n/a Refill authorized: 02/10/16 #1 0R. Please advise. Thank you.

## 2016-04-19 ENCOUNTER — Other Ambulatory Visit: Payer: Self-pay | Admitting: Family

## 2016-04-20 NOTE — Telephone Encounter (Signed)
Routing to greg, please advise, thanks 

## 2016-05-21 ENCOUNTER — Other Ambulatory Visit: Payer: Self-pay | Admitting: Family

## 2016-05-26 ENCOUNTER — Encounter: Payer: Self-pay | Admitting: Family

## 2016-05-26 ENCOUNTER — Other Ambulatory Visit: Payer: Self-pay | Admitting: Family

## 2016-05-26 MED ORDER — VENLAFAXINE HCL ER 37.5 MG PO CP24: 37.5000 mg | ORAL_CAPSULE | Freq: Every day | ORAL | 1 refills | Status: DC

## 2016-06-21 ENCOUNTER — Other Ambulatory Visit: Payer: Self-pay | Admitting: Family

## 2016-06-24 ENCOUNTER — Other Ambulatory Visit: Payer: Self-pay | Admitting: Nurse Practitioner

## 2016-06-24 ENCOUNTER — Encounter: Payer: Self-pay | Admitting: Nurse Practitioner

## 2016-06-24 ENCOUNTER — Other Ambulatory Visit: Payer: Self-pay | Admitting: Obstetrics & Gynecology

## 2016-06-24 MED ORDER — VALACYCLOVIR HCL 500 MG PO TABS: ORAL_TABLET | ORAL | 1 refills | Status: DC

## 2016-06-24 NOTE — Telephone Encounter (Signed)
Medication refill request: Valtrex  Last AEX:  08-28-15  Next AEX: 08-27-17  Last MMG (if hormonal medication request): N/A Refill authorized: please advise

## 2016-07-10 ENCOUNTER — Ambulatory Visit (INDEPENDENT_AMBULATORY_CARE_PROVIDER_SITE_OTHER): Payer: BC Managed Care – PPO | Admitting: Family

## 2016-07-10 ENCOUNTER — Encounter: Payer: Self-pay | Admitting: Family

## 2016-07-10 VITALS — BP 106/78 | HR 74 | Temp 98.6°F | Resp 14 | Ht 64.75 in | Wt 137.1 lb

## 2016-07-10 DIAGNOSIS — F411 Generalized anxiety disorder: Secondary | ICD-10-CM | POA: Diagnosis not present

## 2016-07-10 MED ORDER — VENLAFAXINE HCL ER 37.5 MG PO CP24: 37.5000 mg | ORAL_CAPSULE | Freq: Every day | ORAL | 3 refills | Status: DC

## 2016-07-10 NOTE — Assessment & Plan Note (Addendum)
Anxiety appears adequately controlled with current medication regimen and no adverse side effects. No exacerbations or panic attacks. Encouraged continued stress and stress management. Continue current dosage of venlafaxine.

## 2016-07-10 NOTE — Patient Instructions (Signed)
Thank you for choosing [facility].  SUMMARY AND INSTRUCTIONS:  Congratulations on your graduation!  Continue to take your medication as prescribed.  Follow up in 1 year or sooner if needed.  Medication:  Your prescription(s) have been submitted to your pharmacy or been printed and provided for you. Please take as directed and contact our office if you believe you are having problem(s) with the medication(s) or have any questions.  Follow up:  If your symptoms worsen or fail to improve, please contact our office for further instruction, or in case of emergency go directly to the emergency room at the closest medical facility.

## 2016-07-10 NOTE — Progress Notes (Signed)
   Subjective:    Patient ID: Dana Welch, female    DOB: 11/04/1995, 21 y.o.   MRN: [id number]  Chief Complaint  Patient presents with  . Follow-up    would like refills on effexor    HPI:  Dana Welch is a 21 y.o. female who  has a past medical history of ADHD (attention deficit hyperactivity disorder); Anxiety; Dysmenorrhea; HSV-1 infection; and UTI (lower urinary tract infection). and presents today for a follow up office visit.   Anxiety currently maintained on venlafaxine. Reports taking The medication as prescribed and denies adverse side effects. Symptoms are generally well-controlled current medication regimen. Recently graduated from school and has obtain employment.   No Known Allergies    Outpatient Medications Prior to Visit  Medication Sig Dispense Refill  . JUNEL FE 1/20 1-20 MG-MCG tablet TAKE 1 TABLET BY MOUTH EVERY DAY 3 Package 2  . valACYclovir (VALTREX) 500 MG tablet 1 tablet BID for flare for 3-5 days prn 30 tablet 1  . venlafaxine XR (EFFEXOR-XR) 37.5 MG 24 hr capsule Take 1 capsule (37.5 mg total) by mouth daily with breakfast. 30 capsule 1  . venlafaxine XR (EFFEXOR-XR) 37.5 MG 24 hr capsule TAKE 1 CAPSULE (37.5 MG TOTAL) BY MOUTH DAILY WITH BREAKFAST. NEEDS OFFICE VISIT FOR MORE REFILLS 30 capsule 0   No facility-administered medications prior to visit.      Review of Systems  Constitutional: Negative for chills and fever.  Psychiatric/Behavioral: Negative for decreased concentration, hallucinations and sleep disturbance. The patient is not nervous/anxious.       Objective:    BP 106/78 (BP Location: Left Arm, Patient Position: Sitting, Cuff Size: Normal)   Pulse 74   Temp 98.6 F (37 C) (Oral)   Resp 14   Ht 5' 4.75" (1.645 m)   Wt 137 lb 1.9 oz (62.2 kg)   SpO2 95%   BMI 22.99 kg/m  Nursing note and vital signs reviewed.  Physical Exam  Constitutional: She is oriented to person, place, and time. She appears well-developed and  well-nourished. No distress.  Cardiovascular: Normal rate, regular rhythm, normal heart sounds and intact distal pulses.   Pulmonary/Chest: Effort normal and breath sounds normal.  Neurological: She is alert and oriented to person, place, and time.  Skin: Skin is warm and dry.  Psychiatric: She has a normal mood and affect. Her behavior is normal. Judgment and thought content normal.       Assessment & Plan:   Problem List Items Addressed This Visit      Other   Generalized anxiety disorder - Primary    Anxiety appears adequately controlled with current medication regimen and no adverse side effects. No exacerbations or panic attacks. Encouraged continued stress and stress management. Continue current dosage of venlafaxine.          I am having Dana Welch maintain her JUNEL FE 1/20, valACYclovir, and venlafaxine XR.   Meds ordered this encounter  Medications  . venlafaxine XR (EFFEXOR-XR) 37.5 MG 24 hr capsule    Sig: Take 1 capsule (37.5 mg total) by mouth daily with breakfast.    Dispense:  90 capsule    Refill:  3    Order Specific Question:   Supervising Provider    Answer:   Hillard DankerRAWFORD, ELIZABETH A [[id number]]     Follow-up: Return in about 1 year (around 07/10/2017), or if symptoms worsen or fail to improve.  Jeanine Luzalone, Fares Ramthun, FNP

## 2016-07-25 ENCOUNTER — Other Ambulatory Visit: Payer: Self-pay | Admitting: Family

## 2016-07-30 ENCOUNTER — Other Ambulatory Visit: Payer: Self-pay | Admitting: Family

## 2016-08-28 ENCOUNTER — Ambulatory Visit: Payer: BC Managed Care – PPO | Admitting: Nurse Practitioner

## 2016-08-30 ENCOUNTER — Other Ambulatory Visit: Payer: Self-pay | Admitting: Family

## 2016-09-01 ENCOUNTER — Encounter: Payer: Self-pay | Admitting: Nurse Practitioner

## 2016-09-01 ENCOUNTER — Ambulatory Visit (INDEPENDENT_AMBULATORY_CARE_PROVIDER_SITE_OTHER): Payer: BC Managed Care – PPO | Admitting: Nurse Practitioner

## 2016-09-01 VITALS — BP 110/70 | HR 84 | Resp 16 | Ht 64.5 in | Wt 135.0 lb

## 2016-09-01 DIAGNOSIS — Z113 Encounter for screening for infections with a predominantly sexual mode of transmission: Secondary | ICD-10-CM

## 2016-09-01 DIAGNOSIS — Z0000 Encounter for general adult medical examination without abnormal findings: Secondary | ICD-10-CM

## 2016-09-01 DIAGNOSIS — Z01419 Encounter for gynecological examination (general) (routine) without abnormal findings: Secondary | ICD-10-CM

## 2016-09-01 MED ORDER — VALACYCLOVIR HCL 500 MG PO TABS: ORAL_TABLET | ORAL | 3 refills | Status: DC

## 2016-09-01 MED ORDER — NORETHIN ACE-ETH ESTRAD-FE 1-20 MG-MCG PO TABS: 1.0000 | ORAL_TABLET | Freq: Every day | ORAL | 4 refills | Status: DC

## 2016-09-01 NOTE — Patient Instructions (Signed)

## 2016-09-01 NOTE — Progress Notes (Signed)
21 y.o. G0P0000 Single  Caucasian Fe here for annual exam.  Menses now at 4-7 days.  Menses is lighter on stressful days.   Doing well on OCP.  Only oc HSV flares.  Same partner for 7 yrs.  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  Patient's last menstrual period was 08/18/2016.          Sexually active: Yes.    The current method of family planning is OCP (estrogen/progesterone).    Exercising: Yes.    walking/ running Smoker:  no  Health Maintenance: Pap:  08-28-15 WNL NEG HR HPV History of Abnormal Pap: no  Self Breast exams: yes TDaP:  2010 HIV: 2017  Labs:   reports that she has never smoked. She has never used smokeless tobacco. She reports that she drinks alcohol. She reports that she does not use drugs.  Past Medical History:  Diagnosis Date  . ADHD (attention deficit hyperactivity disorder)   . Anxiety   . Dysmenorrhea   . HSV-1 infection   . UTI (lower urinary tract infection)     Past Surgical History:  Procedure Laterality Date  . WISDOM TOOTH EXTRACTION      Current Outpatient Prescriptions  Medication Sig Dispense Refill  . JUNEL FE 1/20 1-20 MG-MCG tablet TAKE 1 TABLET BY MOUTH EVERY DAY 3 Package 2  . valACYclovir (VALTREX) 500 MG tablet 1 tablet BID for flare for 3-5 days prn 30 tablet 1  . venlafaxine XR (EFFEXOR-XR) 37.5 MG 24 hr capsule Take 1 capsule (37.5 mg total) by mouth daily with breakfast. 90 capsule 3   No current facility-administered medications for this visit.     Family History  Problem Relation Age of Onset  . Thyroid disease Mother   . Heart attack Maternal Grandfather   . Rashes / Skin problems Maternal Grandfather   . Cancer Paternal Grandmother        kidney  . Hypertension Father     ROS:  Pertinent items are noted in HPI.  Otherwise, a comprehensive ROS was negative.  Exam:   BP 110/70 (BP Location: Right Arm, Patient Position: Sitting, Cuff Size: Normal)   Pulse 84   Resp 16   Ht  5' 4.5" (1.638 m)   Wt 135 lb (61.2 kg)   LMP 08/18/2016   BMI 22.81 kg/m  Height: 5' 4.5" (163.8 cm) Ht Readings from Last 3 Encounters:  09/01/16 5' 4.5" (1.638 m)  07/10/16 5' 4.75" (1.645 m)  08/28/15 5' 4.75" (1.645 m)    General appearance: alert, cooperative and appears stated age Head: Normocephalic, without obvious abnormality, atraumatic Neck: no adenopathy, supple, symmetrical, trachea midline and thyroid normal to inspection and palpation Lungs: clear to auscultation bilaterally Breasts: normal appearance, no masses or tenderness Heart: regular rate and rhythm Abdomen: soft, non-tender; no masses,  no organomegaly Extremities: extremities normal, atraumatic, no cyanosis or edema Skin: Skin color, texture, turgor normal. No rashes or lesions Lymph nodes: Cervical, supraclavicular, and axillary nodes normal. No abnormal inguinal nodes palpated Neurologic: Grossly normal   Pelvic: External genitalia:  no lesions              Urethra:  normal appearing urethra with no masses, tenderness or lesions              Bartholin's and Skene's: normal                 Vagina: normal appearing vagina with normal color and  discharge, no lesions              Cervix: anteverted              Pap taken: No. Bimanual Exam:  Uterus:  normal size, contour, position, consistency, mobility, non-tender              Adnexa: no mass, fullness, tenderness               Rectovaginal: Confirms               Anus:  normal sphincter tone, no lesions  Chaperone present: yes  A:  Well Woman with normal exam  OCP for contraception  R/O STD's  History of HSV  History of GAD - doing well on med's   P:   Reviewed health and wellness pertinent to exam  Pap smear: no  Will follow with labs  Refill on OCP for a year  Refill on Valtrex prn for a year  Counseled on breast self exam, STD prevention, HIV risk factors and prevention, use and side effects of OCP's, adequate intake of calcium and vitamin  D, diet and exercise return annually or prn  An After Visit Summary was printed and given to the patient

## 2016-09-02 LAB — HEP, RPR, HIV PANEL
HIV Screen 4th Generation wRfx: NONREACTIVE
Hepatitis B Surface Ag: NEGATIVE
RPR Ser Ql: NONREACTIVE

## 2016-09-02 NOTE — Progress Notes (Signed)
Reviewed personally.  M. Suzanne Trenisha Lafavor, MD.  

## 2016-09-03 LAB — GC/CHLAMYDIA PROBE AMP
CHLAMYDIA, DNA PROBE: NEGATIVE
Neisseria gonorrhoeae by PCR: NEGATIVE

## 2016-11-30 ENCOUNTER — Other Ambulatory Visit: Payer: Self-pay | Admitting: Family

## 2017-02-19 ENCOUNTER — Telehealth: Payer: Self-pay | Admitting: Family

## 2017-02-19 MED ORDER — VENLAFAXINE HCL ER 37.5 MG PO CP24: ORAL_CAPSULE | ORAL | 0 refills | Status: DC

## 2017-02-19 NOTE — Telephone Encounter (Signed)
Notified pt [facility] until appt w/Ashley 02/24/17.Marland Kitchen.Raechel Chute/lmb

## 2017-02-19 NOTE — Telephone Encounter (Signed)
Copied from CRM [id number]. Topic: Quick Communication - Rx Refill/Question >> Feb 19, 2017 11:36 AM Herby AbrahamJohnson, Shiquita C wrote: Pt called in to request another Rx for venlafaxine. Pt says that she lives in [place], [place] and comes back in forth. Pt says that she [place] at home in [place] and would like to know if provider would send in a new Rx to local pharmacy for her? Pt says that she will pay out of pocket if she need to.    [id number] [place] [address]  Agent: Please be advised that RX refills may take up to 3 business days. We ask that you follow-up with your pharmacy.

## 2017-02-24 ENCOUNTER — Encounter: Payer: Self-pay | Admitting: Nurse Practitioner

## 2017-02-24 ENCOUNTER — Ambulatory Visit: Payer: BC Managed Care – PPO | Admitting: Nurse Practitioner

## 2017-02-24 VITALS — BP 118/78 | HR 88 | Temp 98.2°F | Ht 64.0 in | Wt 132.0 lb

## 2017-02-24 DIAGNOSIS — F411 Generalized anxiety disorder: Secondary | ICD-10-CM

## 2017-02-24 DIAGNOSIS — J302 Other seasonal allergic rhinitis: Secondary | ICD-10-CM

## 2017-02-24 DIAGNOSIS — J3089 Other allergic rhinitis: Secondary | ICD-10-CM

## 2017-02-24 DIAGNOSIS — J309 Allergic rhinitis, unspecified: Secondary | ICD-10-CM | POA: Insufficient documentation

## 2017-02-24 MED ORDER — VENLAFAXINE HCL ER 37.5 MG PO CP24: 37.5000 mg | ORAL_CAPSULE | Freq: Every day | ORAL | 3 refills | Status: DC

## 2017-02-24 MED ORDER — TRIAMCINOLONE ACETONIDE 55 MCG/ACT NA AERO: 2.0000 | INHALATION_SPRAY | Freq: Every day | NASAL | 12 refills | Status: DC

## 2017-02-24 NOTE — Assessment & Plan Note (Signed)
History consistent with seasonal allergies. PE with no alarming findings. She requests nasacort for symptoms Medications ordered: - triamcinolone (NASACORT) 55 MCG/ACT AERO nasal inhaler; Place 2 sprays into the nose daily.  Dispense: 1 Inhaler; Refill: 12 Pt education handout given. Return precautions given.

## 2017-02-24 NOTE — Progress Notes (Signed)
Subjective:    Patient ID: Dana Welch, female    DOB: 05/29/1995, 21 y.o.   MRN: [id number]  HPI Dana Welch is a 21 yo female who presents today to establish care. She Is transferring to me from another provider in the same clinic. She Has the following significant medical problems: generalized anxiety disorder, ADHD. She is maintained on effexor daily for her chronic medical problems. She presents today with request for effexor refill and with acute complaint of allergies.  Generalized anxiety disorder- maintained on effexor 37.5 once daily. She reports she has been on the effexor for about 3 years. She tolerates the medication well without any adverse effects. She does have occasional worry, maybe once a week, about big stressors, but otherwise feels her anxiety is well controlled on effexor and she does not worry daily or worry about things she can not control. She has never had any thoughts of hurting herself or others. She does not feel depressed.  Allergy symptoms- This is an exacerbation of a chronic problem. She reports annual seasonal allergies for the past several years Her symptoms being around November and last until the spring. She reports nasal congesiton, postnasal drip, tichy and watery eyes, dry cough, ear stuffiness. She denies fevers, headaches, vision changes, chest pain, shortness of breath. Overall she feels well. She has been on nasacort in the past which helped her symptoms. She has tried flonase also, but felt it caused her to have nosebleeds   Review of Systems  See HPI  Past Medical History:  Diagnosis Date  . ADHD (attention deficit hyperactivity disorder)   . Anxiety   . Dysmenorrhea   . HSV-1 infection   . UTI (lower urinary tract infection)      Social History   Socioeconomic History  . Marital status: Single    Spouse name: Not on file  . Number of children: 0  . Years of education: 1316  . Highest education level: Not on file  Social Needs   . Financial resource strain: Not on file  . Food insecurity - worry: Not on file  . Food insecurity - inability: Not on file  . Transportation needs - medical: Not on file  . Transportation needs - non-medical: Not on file  Occupational History  . Occupation: Consulting civil engineertudent    Comment: [employer]  Tobacco Use  . Smoking status: Never Smoker  . Smokeless tobacco: Never Used  Substance and Sexual Activity  . Alcohol use: Yes    Comment: Rarly   . Drug use: No  . Sexual activity: Yes    Partners: Male    Birth control/protection: Pill  Other Topics Concern  . Not on file  Social History Narrative   Fun: Exercise, golf, be at home.    Denies abuse and feels safe at home.     Past Surgical History:  Procedure Laterality Date  . WISDOM TOOTH EXTRACTION      Family History  Problem Relation Age of Onset  . Thyroid disease Mother   . Heart attack Maternal Grandfather   . Rashes / Skin problems Maternal Grandfather   . Cancer Paternal Grandmother        kidney  . Hypertension Father     No Known Allergies  Current Outpatient Medications on File Prior to Visit  Medication Sig Dispense Refill  . norethindrone-ethinyl estradiol (JUNEL FE 1/20) 1-20 MG-MCG tablet Take 1 tablet by mouth daily. 3 Package 4  . valACYclovir (VALTREX) 500 MG  tablet 1 tablet BID for flare for 3-5 days prn 90 tablet 3  . venlafaxine XR (EFFEXOR-XR) 37.5 MG 24 hr capsule Take 1 capsule (37.5 mg total) by mouth daily with breakfast. 90 capsule 3   No current facility-administered medications on file prior to visit.     BP 118/78   Pulse 88   Temp 98.2 F (36.8 C) (Oral)   Ht 5\' 4"  (1.626 m)   Wt 132 lb (59.9 kg)   LMP 02/02/2017 (Exact Date)   SpO2 99%   BMI 22.66 kg/m        Objective:   Physical Exam  Constitutional: She is oriented to person, place, and time. She appears well-developed and well-nourished. No distress.  HENT:  Head: Normocephalic and atraumatic.  Right Ear:  Tympanic membrane, external ear and ear canal normal.  Left Ear: Tympanic membrane, external ear and ear canal normal.  Nose: Right sinus exhibits no maxillary sinus tenderness and no frontal sinus tenderness. Left sinus exhibits no maxillary sinus tenderness and no frontal sinus tenderness.  Mouth/Throat: Uvula is midline, oropharynx is clear and moist and mucous membranes are normal.  Neck: Normal range of motion. Neck supple.  Cardiovascular: Normal rate, regular rhythm, normal heart sounds and intact distal pulses.  Pulmonary/Chest: Effort normal and breath sounds normal.  Lymphadenopathy:    She has no cervical adenopathy.  Neurological: She is alert and oriented to person, place, and time. Coordination normal.  Skin: Skin is warm and dry.  Psychiatric: She has a normal mood and affect. Her behavior is normal. Judgment and thought content normal.      Assessment & Plan:  RTC for CPE or for acute visit as needed

## 2017-02-24 NOTE — Assessment & Plan Note (Signed)
Continue effexor at current dosage Medications ordered: - venlafaxine XR (EFFEXOR-XR) 37.5 MG 24 hr capsule; Take 1 capsule (37.5 mg total) by mouth daily with breakfast.  Dispense: 90 capsule; Refill: 3

## 2017-02-24 NOTE — Patient Instructions (Addendum)
I have sent effexor refill for you.  I have sent a prescription for nasacort inhaler- 2 sprays in each nostril day. Then reduce to 1 spray in each nostril daily when your symptoms have improved.  You can return for an annual physical at your convenience, or sooner if you need me.  It was nice to meet you. Thanks for letting me take care of you today :)  Allergic Rhinitis, Adult Allergic rhinitis is an allergic reaction that affects the mucous membrane inside the nose. It causes sneezing, a runny or stuffy nose, and the feeling of mucus going down the back of the throat (postnasal drip). Allergic rhinitis can be mild to severe. There are two types of allergic rhinitis:  Seasonal. This type is also called hay fever. It happens only during certain seasons.  Perennial. This type can happen at any time of the year.  What are the causes? This condition happens when the body's defense system (immune system) responds to certain harmless substances called allergens as though they were germs.  Seasonal allergic rhinitis is triggered by pollen, which can come from grasses, trees, and weeds. Perennial allergic rhinitis may be caused by:  House dust mites.  Pet dander.  Mold spores.  What are the signs or symptoms? Symptoms of this condition include:  Sneezing.  Runny or stuffy nose (nasal congestion).  Postnasal drip.  Itchy nose.  Tearing of the eyes.  Trouble sleeping.  Daytime sleepiness.  How is this diagnosed? This condition may be diagnosed based on:  Your medical history.  A physical exam.  Tests to check for related conditions, such as: ? Asthma. ? Pink eye. ? Ear infection. ? Upper respiratory infection.  Tests to find out which allergens trigger your symptoms. These may include skin or blood tests.  How is this treated? There is no cure for this condition, but treatment can help control symptoms. Treatment may include:  Taking medicines that block allergy  symptoms, such as antihistamines. Medicine may be given as a shot, nasal spray, or pill.  Avoiding the allergen.  Desensitization. This treatment involves getting ongoing shots until your body becomes less sensitive to the allergen. This treatment may be done if other treatments do not help.  If taking medicine and avoiding the allergen does not work, new, stronger medicines may be prescribed.  Follow these instructions at home:  Find out what you are allergic to. Common allergens include smoke, dust, and pollen.  Avoid the things you are allergic to. These are some things you can do to help avoid allergens: ? Replace carpet with wood, tile, or vinyl flooring. Carpet can trap dander and dust. ? Do not smoke. Do not allow smoking in your home. ? Change your heating and air conditioning filter at least once a month. ? During allergy season:  Keep windows closed as much as possible.  Plan outdoor activities when pollen counts are lowest. This is usually during the evening hours.  When coming indoors, change clothing and shower before sitting on furniture or bedding.  Take over-the-counter and prescription medicines only as told by your health care provider.  Keep all follow-up visits as told by your health care provider. This is important. Contact a health care provider if:  You have a fever.  You develop a persistent cough.  You make whistling sounds when you breathe (you wheeze).  Your symptoms interfere with your normal daily activities. Get help right away if:  You have shortness of breath. Summary  This condition  can be managed by taking medicines as directed and avoiding allergens.  Contact your health care provider if you develop a persistent cough or fever.  During allergy season, keep windows closed as much as possible. This information is not intended to replace advice given to you by your health care provider. Make sure you discuss any questions you have with  your health care provider. Document Released: 11/11/2000 Document Revised: 03/26/2016 Document Reviewed: 03/26/2016 Elsevier Interactive Patient Education  [employer]2018 Elsevier Inc.

## 2017-02-26 ENCOUNTER — Telehealth: Payer: Self-pay | Admitting: Nurse Practitioner

## 2017-02-26 MED ORDER — FLUTICASONE PROPIONATE 50 MCG/ACT NA SUSP: 2.0000 | Freq: Every day | NASAL | 6 refills | Status: DC

## 2017-02-26 NOTE — Telephone Encounter (Signed)
Routed to provider

## 2017-02-26 NOTE — Telephone Encounter (Signed)
Can you please advise in Ashleigh's absence? Thank you!

## 2017-02-26 NOTE — Telephone Encounter (Signed)
Copied from CRM [id number]. Topic: Quick Communication - See Telephone Encounter >> Feb 26, 2017 12:59 PM Everardo PacificMoton, Joscelyn Hardrick, [place] wrote: CRM for notification. See Telephone encounter for: Patient calling because her insurance doesn't cover her Nasacort medication and she would like to know if there were a different medication that she could have. If someone from the office could give her a call back at [phone]  02/26/17.

## 2017-02-26 NOTE — Telephone Encounter (Signed)
flonase sent to [facility]

## 2017-08-19 ENCOUNTER — Encounter: Payer: Self-pay | Admitting: Obstetrics & Gynecology

## 2017-08-20 ENCOUNTER — Telehealth: Payer: Self-pay | Admitting: Obstetrics & Gynecology

## 2017-08-20 NOTE — Telephone Encounter (Signed)
Left message to call Dana Welch at [phone].  

## 2017-08-20 NOTE — Telephone Encounter (Signed)
Patient sent the following message through MyChart. Routing to triage to assist patient with request.  ----- Message from Mychart, Generic sent at 08/19/2017 9:00 PM EDT -----    Hi Dr. Hyacinth MeekerMiller,    I was wondering if I could get another birth control medication that was a little cheaper. Over the years I have been paying around 50 dollars for a 3 month supply.     Thanks,    Safeco CorporationCari Teague

## 2017-08-23 NOTE — Telephone Encounter (Signed)
I actually tried to look up her formulary and was unsuccessful so I think it is a good recommendation for her to try and bring a copy.  That would be very helpful.  Ok to close encounter.

## 2017-08-23 NOTE — Telephone Encounter (Signed)
Spoke with patient. Patient looking for OCP that is cheaper than Junel RX.   Advised patient to check with insurance provider for list of covered formulary RX. Patient will plan to bring list with her to AEX on 09/17/17 to discuss possible alternatives.   Advised Dr. Hyacinth MeekerMiller will review, I will return call with any additional recommendations.   Routing to provider for final review. Patient is agreeable to disposition. Will close encounter.

## 2017-09-03 ENCOUNTER — Ambulatory Visit: Payer: BC Managed Care – PPO | Admitting: Certified Nurse Midwife

## 2017-09-06 ENCOUNTER — Other Ambulatory Visit: Payer: Self-pay

## 2017-09-06 MED ORDER — NORETHIN ACE-ETH ESTRAD-FE 1-20 MG-MCG PO TABS: 1.0000 | ORAL_TABLET | Freq: Every day | ORAL | 0 refills | Status: DC

## 2017-09-06 NOTE — Telephone Encounter (Signed)
Medication refill request: Junel Fe  Last AEX:  09/01/16 with Shirlyn GoltzPatty Grubb  Next AEX: 09/28/17 Last MMG (if hormonal medication request): NA Refill authorized: Please refill until AEX if appropriate.

## 2017-09-07 ENCOUNTER — Other Ambulatory Visit: Payer: Self-pay

## 2017-09-07 NOTE — Telephone Encounter (Signed)
Second refill request came in for jenel fe. Rx has been routed to Dr. Hyacinth MeekerMiller.

## 2017-09-17 ENCOUNTER — Ambulatory Visit: Payer: BC Managed Care – PPO | Admitting: Obstetrics & Gynecology

## 2017-09-28 ENCOUNTER — Encounter: Payer: Self-pay | Admitting: Obstetrics and Gynecology

## 2017-09-28 ENCOUNTER — Telehealth: Payer: Self-pay

## 2017-09-28 ENCOUNTER — Encounter: Payer: Self-pay | Admitting: Nurse Practitioner

## 2017-09-28 ENCOUNTER — Ambulatory Visit: Payer: BC Managed Care – PPO | Admitting: Obstetrics and Gynecology

## 2017-09-28 VITALS — BP 120/74 | HR 82 | Ht 65.0 in | Wt 133.0 lb

## 2017-09-28 DIAGNOSIS — Z01419 Encounter for gynecological examination (general) (routine) without abnormal findings: Secondary | ICD-10-CM | POA: Diagnosis not present

## 2017-09-28 MED ORDER — NORETHIN ACE-ETH ESTRAD-FE 1-20 MG-MCG PO TABS: 1.0000 | ORAL_TABLET | Freq: Every day | ORAL | 3 refills | Status: DC

## 2017-09-28 MED ORDER — VALACYCLOVIR HCL 500 MG PO TABS: ORAL_TABLET | ORAL | 3 refills | Status: DC

## 2017-09-28 NOTE — Telephone Encounter (Signed)
Left message to call Kaitlyn at [phone].  Patient left today without having lab work. Would she like to return to have this done?

## 2017-09-28 NOTE — Telephone Encounter (Signed)
Spoke with patient. Patient would like to return to have this done. Will check schedule and return call.

## 2017-09-28 NOTE — Progress Notes (Signed)
22 y.o. G0P0000 Single Caucasian female here for annual exam.    Happy with her OCP. Remembers to take them.  Control cramps.   Declines STD testing.   Thinking about stopping Effexor.  Feels terrible when she forgets a dosage.   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  Just bought a house.   PCP:  Catarina Hartshorn   LMP: 09/13/17         Sexually active: Yes.    The current method of family planning is pills.    Exercising: Yes.    walking Smoker:  yes  Health Maintenance: Pap:  08-28-2015 WNL NEG HR HPV History of abnormal Pap:  no MMG:  N/A Colonoscopy:  N/A BMD:   N/A TDaP:  03/02/2008 Gardasil:   yes HIV: 09/01/2016 negative Hep C: Never Screening Labs:  Discuss today   reports that she has never smoked. She has never used smokeless tobacco. She reports that she drinks alcohol. She reports that she does not use drugs.  Past Medical History:  Diagnosis Date  . ADHD (attention deficit hyperactivity disorder)   . Anxiety   . Dysmenorrhea   . HSV-1 infection   . UTI (lower urinary tract infection)     Past Surgical History:  Procedure Laterality Date  . WISDOM TOOTH EXTRACTION      Current Outpatient Medications  Medication Sig Dispense Refill  . fluticasone (FLONASE) 50 MCG/ACT nasal spray Place 2 sprays into both nostrils daily. 16 g 6  . norethindrone-ethinyl estradiol (JUNEL FE 1/20) 1-20 MG-MCG tablet Take 1 tablet by mouth daily. 3 Package 3  . triamcinolone (NASACORT) 55 MCG/ACT AERO nasal inhaler Place 2 sprays into the nose daily. 1 Inhaler 12  . valACYclovir (VALTREX) 500 MG tablet Take one tablet (500 mg) by mouth daily for infection prevention.  Take one tablet (500 mg) by mouth twice a day for 3 days as needed. 100 tablet 3  . venlafaxine XR (EFFEXOR-XR) 37.5 MG 24 hr capsule Take 1 capsule (37.5 mg total) by mouth daily with breakfast. 90 capsule 3   No current facility-administered medications for this visit.     Family History  Problem  Relation Age of Onset  . Thyroid disease Mother   . Heart attack Maternal Grandfather   . Rashes / Skin problems Maternal Grandfather   . Cancer Paternal Grandmother        kidney  . Hypertension Father     Review of Systems  Constitutional: Negative.   HENT: Negative.   Eyes: Negative.   Respiratory: Negative.   Cardiovascular: Negative.   Gastrointestinal: Negative.   Endocrine: Negative.   Genitourinary: Negative.   Musculoskeletal: Negative.   Skin: Negative.   Allergic/Immunologic: Negative.   Neurological: Negative.   Hematological: Negative.   Psychiatric/Behavioral: Negative.     Exam:   BP 120/74   Pulse 82   Ht 5\' 5"  (1.651 m)   Wt 133 lb (60.3 kg)   BMI 22.13 kg/m     General appearance: alert, cooperative and appears stated age Head: Normocephalic, without obvious abnormality, atraumatic Neck: no adenopathy, supple, symmetrical, trachea midline and thyroid normal to inspection and palpation Lungs: clear to auscultation bilaterally Breasts: normal appearance, no masses or tenderness, No nipple retraction or dimpling, No nipple discharge or bleeding, No axillary or supraclavicular adenopathy Heart: regular rate and rhythm Abdomen: soft, non-tender; no masses, no organomegaly Extremities: extremities normal, atraumatic, no cyanosis or edema Skin: Skin color, texture, turgor normal. No rashes or lesions Lymph nodes:  Cervical, supraclavicular, and axillary nodes normal. No abnormal inguinal nodes palpated Neurologic: Grossly normal  Pelvic: External genitalia:  no lesions              Urethra:  normal appearing urethra with no masses, tenderness or lesions              Bartholins and Skenes: normal                 Vagina: normal appearing vagina with normal color and discharge, no lesions              Cervix: no lesions              Pap taken: No. Bimanual Exam:  Uterus:  normal size, contour, position, consistency, mobility, non-tender              Adnexa:  no mass, fullness, tenderness               Chaperone was present for exam.  Assessment:   Well woman visit with normal exam. 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   Anxiety.  Plan: Mammogram screening. Recommended self breast awareness. Pap and HR HPV as above. Guidelines for Calcium, Vitamin D, regular exercise program including cardiovascular and weight bearing exercise. Refill OCPs for one year.  Refill Valtrex yearly prescription.  She will take daily. We talked about benefits of reducing asymptomatic shedding.  She will contact her PCP regarding her Effexor Rx.  Follow up annually and prn.   After visit summary provided.

## 2017-09-28 NOTE — Patient Instructions (Signed)

## 2017-09-28 NOTE — Telephone Encounter (Signed)
Patient returning call to Kaitlyn. °

## 2017-10-06 NOTE — Telephone Encounter (Signed)
Spoke with patient. Lab appointment scheduled for 10/15/2017 at 9 am. Patient is agreeable to date and time.  Routing to provider for final review. Patient agreeable to disposition. Will close encounter.

## 2017-10-15 ENCOUNTER — Other Ambulatory Visit (INDEPENDENT_AMBULATORY_CARE_PROVIDER_SITE_OTHER): Payer: BC Managed Care – PPO

## 2017-10-15 ENCOUNTER — Other Ambulatory Visit: Payer: Self-pay | Admitting: Obstetrics and Gynecology

## 2017-10-15 DIAGNOSIS — Z0000 Encounter for general adult medical examination without abnormal findings: Secondary | ICD-10-CM

## 2017-10-16 LAB — COMPREHENSIVE METABOLIC PANEL
ALT: 11 IU/L (ref 0–32)
AST: 16 IU/L (ref 0–40)
Albumin/Globulin Ratio: 1.8 (ref 1.2–2.2)
Albumin: 4.7 g/dL (ref 3.5–5.5)
Alkaline Phosphatase: 64 IU/L (ref 39–117)
BUN/Creatinine Ratio: 15 (ref 9–23)
BUN: 13 mg/dL (ref 6–20)
Bilirubin Total: 0.4 mg/dL (ref 0.0–1.2)
CO2: 24 mmol/L (ref 20–29)
CREATININE: 0.84 mg/dL (ref 0.57–1.00)
Calcium: 9.7 mg/dL (ref 8.7–10.2)
Chloride: 106 mmol/L (ref 96–106)
GFR calc Af Amer: 113 mL/min/{1.73_m2} (ref >59)
GFR calc non Af Amer: 98 mL/min/{1.73_m2} (ref >59)
GLOBULIN, TOTAL: 2.6 g/dL (ref 1.5–4.5)
Glucose: 75 mg/dL (ref 65–99)
Potassium: 4.1 mmol/L (ref 3.5–5.2)
SODIUM: 143 mmol/L (ref 134–144)
Total Protein: 7.3 g/dL (ref 6.0–8.5)

## 2017-10-16 LAB — LIPID PANEL
CHOL/HDL RATIO: 2.5 ratio (ref 0.0–4.4)
Cholesterol, Total: 168 mg/dL (ref 100–199)
HDL: 67 mg/dL (ref >39)
LDL Calculated: 86 mg/dL (ref 0–99)
TRIGLYCERIDES: 74 mg/dL (ref 0–149)
VLDL Cholesterol Cal: 15 mg/dL (ref 5–40)

## 2017-10-16 LAB — CBC
Hematocrit: 42.5 % (ref 34.0–46.6)
Hemoglobin: 13.6 g/dL (ref 11.1–15.9)
MCH: 29.6 pg (ref 26.6–33.0)
MCHC: 32 g/dL (ref 31.5–35.7)
MCV: 93 fL (ref 79–97)
Platelets: 239 10*3/uL (ref 150–450)
RBC: 4.59 x10E6/uL (ref 3.77–5.28)
RDW: 13 % (ref 12.3–15.4)
WBC: 5 10*3/uL (ref 3.4–10.8)

## 2017-11-03 ENCOUNTER — Encounter: Payer: Self-pay | Admitting: Nurse Practitioner

## 2017-11-04 ENCOUNTER — Encounter: Payer: Self-pay | Admitting: Internal Medicine

## 2017-11-04 ENCOUNTER — Ambulatory Visit: Payer: BC Managed Care – PPO | Admitting: Internal Medicine

## 2017-11-04 VITALS — BP 110/78 | HR 93 | Temp 98.4°F | Ht 65.0 in | Wt 131.0 lb

## 2017-11-04 DIAGNOSIS — F411 Generalized anxiety disorder: Secondary | ICD-10-CM | POA: Diagnosis not present

## 2017-11-04 DIAGNOSIS — J302 Other seasonal allergic rhinitis: Secondary | ICD-10-CM

## 2017-11-04 DIAGNOSIS — R05 Cough: Secondary | ICD-10-CM

## 2017-11-04 DIAGNOSIS — R059 Cough, unspecified: Secondary | ICD-10-CM

## 2017-11-04 MED ORDER — BENZONATATE 100 MG PO CAPS: 100.0000 mg | ORAL_CAPSULE | Freq: Three times a day (TID) | ORAL | 0 refills | Status: DC | PRN

## 2017-11-04 MED ORDER — ESCITALOPRAM OXALATE 10 MG PO TABS: 10.0000 mg | ORAL_TABLET | Freq: Every day | ORAL | 6 refills | Status: DC

## 2017-11-04 MED ORDER — FLUTICASONE PROPIONATE 50 MCG/ACT NA SUSP: 2.0000 | Freq: Every day | NASAL | 6 refills | Status: DC

## 2017-11-04 NOTE — Assessment & Plan Note (Signed)
Will change from effexor to lexapro for better control. Given instructions and advised to follow up with pcp in about 1-2 months.

## 2017-11-04 NOTE — Assessment & Plan Note (Signed)
Likely viral, rx for tessalon perles and flonase to help with symptoms.

## 2017-11-04 NOTE — Progress Notes (Signed)
   Subjective:    Patient ID: Dana Welch, female    DOB: Mar 15, 1995, 22 y.o.   MRN: [id number]  HPI The patient is a 22 YO female coming in for several concerns including cough (going on for about 4-5 days, coughing up yellow sputum, denies fevers or chills, having fatigue, some SOB with exertion, some decrease in appetite, denies sinus pain or headaches, denies ear pain, does have sore throat and some food sticking, tried sudafed and affrin and mucinex and these are not helping although affrin does help her sleep, not taking flonase), and anxiety (tried to come off effexor, did have withdrawal symptoms so started taking it again, does not like how it makes her feel and wants to change, does have a lot of extra stress in her life with marriage coming up in October as well as other things, denies SI/HI, just not satisfied with current treatment).   Review of Systems  Constitutional: Positive for activity change, appetite change and fatigue. Negative for chills, fever and unexpected weight change.  HENT: Positive for congestion, ear pain, postnasal drip and rhinorrhea. Negative for ear discharge, sinus pressure, sinus pain, sneezing, sore throat, tinnitus, trouble swallowing and voice change.   Eyes: Negative.   Respiratory: Positive for cough and shortness of breath. Negative for chest tightness and wheezing.   Cardiovascular: Negative.   Gastrointestinal: Negative.   Musculoskeletal: Positive for myalgias.  Neurological: Negative.   Psychiatric/Behavioral: Positive for agitation and sleep disturbance. Negative for decreased concentration, dysphoric mood, self-injury and suicidal ideas. The patient is nervous/anxious.       Objective:   Physical Exam  Constitutional: She is oriented to person, place, and time. She appears well-developed and well-nourished.  HENT:  Head: Normocephalic and atraumatic.  Oropharynx with redness and clear drainage, nose with swollen turbinates, TMs normal  bilaterally  Eyes: EOM are normal.  Neck: Normal range of motion. No thyromegaly present.  Cardiovascular: Normal rate and regular rhythm.  Pulmonary/Chest: Effort normal and breath sounds normal. No respiratory distress. She has no wheezes. She has no rales.  Abdominal: Soft. Bowel sounds are normal. She exhibits no distension. There is no tenderness. There is no rebound.  Musculoskeletal: She exhibits tenderness. She exhibits no edema.  Lymphadenopathy:    She has no cervical adenopathy.  Neurological: She is alert and oriented to person, place, and time. Coordination normal.  Skin: Skin is warm and dry.  Psychiatric: She has a normal mood and affect.   Vitals:   11/04/17 1051  BP: 110/78  Pulse: 93  Temp: 98.4 F (36.9 C)  TempSrc: Oral  SpO2: 99%  Weight: 131 lb (59.4 kg)  Height: 5\' 5"  (1.651 m)      Assessment & Plan:

## 2017-11-04 NOTE — Patient Instructions (Signed)
We have sent in flonase to use for this as well as the cough medicine to take. The cough medicine you can use up to 3 times per day.   We have also sent in lexapro to try taking instead. You can take 1 pill daily. Do not take effexor and lexapro on the same day. You do not need to stop effexor first just transition to lexapro.

## 2017-11-04 NOTE — Assessment & Plan Note (Signed)
Not using flonase, refilled and encouraged her to use this for 1-2 weeks daily.

## 2017-11-05 ENCOUNTER — Encounter: Payer: Self-pay | Admitting: Internal Medicine

## 2017-11-05 MED ORDER — AZITHROMYCIN 250 MG PO TABS: ORAL_TABLET | ORAL | 0 refills | Status: DC

## 2017-11-05 NOTE — Telephone Encounter (Signed)
I will send in a Z-pak for her. Can you please call her and check on her? I want to make sure we don't need to bring her in again this afternoon for re-check before the weekend.

## 2018-04-28 ENCOUNTER — Other Ambulatory Visit: Payer: Self-pay | Admitting: Internal Medicine

## 2018-04-29 ENCOUNTER — Ambulatory Visit: Payer: BC Managed Care – PPO | Admitting: Nurse Practitioner

## 2018-04-29 ENCOUNTER — Encounter: Payer: Self-pay | Admitting: Nurse Practitioner

## 2018-04-29 VITALS — BP 100/72 | HR 75 | Ht 65.0 in | Wt 142.0 lb

## 2018-04-29 DIAGNOSIS — F411 Generalized anxiety disorder: Secondary | ICD-10-CM | POA: Diagnosis not present

## 2018-04-29 MED ORDER — ESCITALOPRAM OXALATE 10 MG PO TABS
10.0000 mg | ORAL_TABLET | Freq: Every day | ORAL | 1 refills | Status: DC
Start: 2018-04-29 — End: 2018-10-18

## 2018-04-29 NOTE — Patient Instructions (Signed)

## 2018-04-29 NOTE — Progress Notes (Signed)
  Dana Welch is a 23 y.o. female with the following history as recorded in Epic[facility]:  Patient Active Problem List   Diagnosis Date Noted  . Allergic rhinitis 02/24/2017  . Cough 08/21/2015  . Generalized anxiety disorder 11/23/2014  . ADHD (attention deficit hyperactivity disorder) 11/23/2014    Current Outpatient Medications  Medication Sig Dispense Refill  . escitalopram (LEXAPRO) 10 MG tablet Take 1 tablet (10 mg total) by mouth daily. 90 tablet 1  . norethindrone-ethinyl estradiol (JUNEL FE 1/20) 1-20 MG-MCG tablet Take 1 tablet by mouth daily. 3 Package 3  . valACYclovir (VALTREX) 500 MG tablet Take one tablet (500 mg) by mouth daily for infection prevention.  Take one tablet (500 mg) by mouth twice a day for 3 days as needed. 100 tablet 3   No current facility-administered medications for this visit.     Allergies: Patient has no known allergies.  Past Medical History:  Diagnosis Date  . ADHD (attention deficit hyperactivity disorder)   . Anxiety   . Dysmenorrhea   . HSV-1 infection   . UTI (lower urinary tract infection)     Past Surgical History:  Procedure Laterality Date  . WISDOM TOOTH EXTRACTION      Family History  Problem Relation Age of Onset  . Thyroid disease Mother   . Heart attack Maternal Grandfather   . Rashes / Skin problems Maternal Grandfather   . Cancer Paternal Grandmother        kidney  . Hypertension Father     Social History   Tobacco Use  . Smoking status: Never Smoker  . Smokeless tobacco: Never Used  Substance Use Topics  . Alcohol use: Yes    Comment: Rarly      Subjective:  Dana Welch is here today requesting lexapro refill, she was seen by another provider here on 11/04/17, her effexor was switched to lexapro for better control of anxiety, she was instructed to return in about 1-2 months for follow up visit but did not come back until today, due to needing a refill. She tells me she feels much better on lexapro, has been taking daily  as prescribed without noted adverse effects, she had been experiencing night sweats and trouble sleeping on Effexor and these symptoms resolved once she started lexapro. She feels the lexapro controls her anxiety well, and would like to continue No fevers, chills, weakness, confusion, cp, sob, insomnia, SI, HI  ROS- See HPI   Objective:  Vitals:   04/29/18 1330  BP: 100/72  Pulse: 75  SpO2: 99%  Weight: 142 lb (64.4 kg)  Height: 5\' 5"  (1.651 m)    General: Well developed, well nourished, in no acute distress  Skin : Warm and dry.  Head: Normocephalic and atraumatic  Eyes: Sclera and conjunctiva clear; pupils round and reactive to light; extraocular movements intact  Oropharynx: Pink, supple. No suspicious lesions  Neck: Supple without thyromegaly Lungs: Respirations unlabored; clear to auscultation bilaterally without wheeze, rales, rhonchi  CVS exam: normal rate and regular rhythm, S1 and S2 normal.  Extremities: No edema, cyanosis, clubbing  Vessels: Symmetric bilaterally  Neurologic: Alert and oriented; speech intact; face symmetrical; moves all extremities well; CNII-XII intact without focal deficit  Psychiatric: Normal mood and affect.  Assessment:  1. Generalized anxiety disorder     Plan:  Return in about 6 months (around 10/28/2018) for routine follow up- anxiety.

## 2018-04-29 NOTE — Assessment & Plan Note (Signed)
Stable Continue lexapro at current dosage F/U in 6 months, or sooner for any new, worsening symptoms additional information on AVS - escitalopram (LEXAPRO) 10 MG tablet; Take 1 tablet (10 mg total) by mouth daily.  Dispense: 90 tablet; Refill: 1

## 2018-09-19 ENCOUNTER — Encounter: Payer: Self-pay | Admitting: Obstetrics and Gynecology

## 2018-09-19 ENCOUNTER — Other Ambulatory Visit: Payer: Self-pay | Admitting: *Deleted

## 2018-09-19 NOTE — Telephone Encounter (Signed)
Medication refill request: Junel  Last AEX:  09-28-17 BS Next AEX: 10-26-18 Last MMG (if hormonal medication request): n/a Refill authorized: Today, please advise.   Medication pended for #1, 1RF as patient has appointment 10-26-2018. Please refill if appropriate.

## 2018-09-20 ENCOUNTER — Telehealth: Payer: Self-pay | Admitting: Obstetrics and Gynecology

## 2018-09-20 MED ORDER — NORETHIN ACE-ETH ESTRAD-FE 1-20 MG-MCG PO TABS: 1.0000 | ORAL_TABLET | Freq: Every day | ORAL | 1 refills | Status: DC

## 2018-09-20 NOTE — Telephone Encounter (Signed)
Ok to start taking new pill pack and use back up protection for at least one week.  If she has had unprotected intercourse since the day she was supposed to start a new pack, she can still start the pills and then do a UPT in 2 weeks.  The pills would not hurt a fetus.   Please remind her that prescription refills are usually filled within 2 business days but not necessarily the same day. We try hard to fill them as efficiently as possible.

## 2018-09-20 NOTE — Telephone Encounter (Signed)
Spoke with patient, advised as seen below per Dr. Silva.  Patient verbalizes understanding and is agreeable.  Encounter closed.  

## 2018-09-20 NOTE — Telephone Encounter (Signed)
Patient sent the following correspondence through [place]. Routing to triage to assist patient with request.  Dr. Quincy Simmonds    I was unable to get another refill on my birth control prescription because my next annual apt isnt until August. Could I get a one month extension to make it until then? Thanks!

## 2018-09-20 NOTE — Telephone Encounter (Signed)
Patient is calling regarding being delayed on getting medication refilled. Patient stated that she was supposed to start new pack on Sunday, but has not received her refill. Patient is wondering how she should go about "taking all of the pills."

## 2018-09-20 NOTE — Telephone Encounter (Signed)
Dr. Scharlene Corn -see open refill request for OCP and advise on missed pills.

## 2018-10-10 ENCOUNTER — Other Ambulatory Visit: Payer: Self-pay | Admitting: Obstetrics and Gynecology

## 2018-10-14 ENCOUNTER — Encounter: Payer: Self-pay | Admitting: Obstetrics and Gynecology

## 2018-10-14 ENCOUNTER — Other Ambulatory Visit: Payer: Self-pay | Admitting: Obstetrics and Gynecology

## 2018-10-14 NOTE — Telephone Encounter (Signed)
Patient sent the following message through [place]. Routing to refill pool to assist patient with request.  Dr. Quincy Simmonds,    My annual exam with you will be on Aug 26th. I am currently out of refills for my birth control. Are you able to write a 1 month prescription to get my through until my apt?     Thanks!  Dana Welch

## 2018-10-14 NOTE — Telephone Encounter (Signed)
Medication refill request: Junel Fe  Last AEX:  09/28/17 Next AEX: 10/26/18 Last MMG (if hormonal medication request): AN Refill authorized: 1 pack 0 rf

## 2018-10-16 MED ORDER — NORETHIN ACE-ETH ESTRAD-FE 1-20 MG-MCG PO TABS: 1.0000 | ORAL_TABLET | Freq: Every day | ORAL | 0 refills | Status: DC

## 2018-10-18 ENCOUNTER — Other Ambulatory Visit: Payer: Self-pay | Admitting: Family

## 2018-10-18 DIAGNOSIS — F411 Generalized anxiety disorder: Secondary | ICD-10-CM

## 2018-10-18 MED ORDER — ESCITALOPRAM OXALATE 10 MG PO TABS: 10.0000 mg | ORAL_TABLET | Freq: Every day | ORAL | 0 refills | Status: DC

## 2018-10-21 ENCOUNTER — Other Ambulatory Visit: Payer: Self-pay | Admitting: *Deleted

## 2018-10-21 NOTE — Telephone Encounter (Signed)
Message left to return call to [facility] at [phone].   Need patient to advise if she needs refill of Valtrex prior to appointment on 10-26-2018 with Dr. Quincy Simmonds.

## 2018-10-21 NOTE — Telephone Encounter (Signed)
Patient returned call. Patient states "I have enough" and will not need Valtrex refilled prior to appointment. RN advised would update pharmacy. Patient agreeable.   Will close encounter.

## 2018-10-24 NOTE — Progress Notes (Signed)
23 y.o. 400P0000 Married Caucasian female here for annual exam.    Is on birth control for 8 years.  Asking if this is ok. Light menses.   Feels cold a lot.  Mother and grandmother with thyroid disease.   Lexapro is working well.   Patient is an Event organiserathletic trainer.  Married in October, 2019.   PCP:  Alphonse GuildAshleigh Shambley, NP   Patient's last menstrual period was 10/18/2018 (exact date).     Period Cycle (Days): 30 Period Duration (Days): 3-4 days Period Pattern: Regular Menstrual Flow: Light Menstrual Control: Panty liner Menstrual Control Change Freq (Hours): every 6 hours for sanitary reasons Dysmenorrhea: (!) Mild Dysmenorrhea Symptoms: Cramping     Sexually active: Yes.    The current method of family planning is OCP (estrogen/progesterone).    Exercising: Yes.    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 She has never used smokeless tobacco. She reports current alcohol use. She reports that she does not use drugs.  Past Medical History:  Diagnosis Date  . ADHD (attention deficit hyperactivity disorder)   . Anxiety   . Dysmenorrhea   . HSV-1 infection   . UTI (lower urinary tract infection)     Past Surgical History:  Procedure Laterality Date  . WISDOM TOOTH EXTRACTION      Current Outpatient Medications  Medication Sig Dispense Refill  . escitalopram (LEXAPRO) 10 MG tablet Take 1 tablet (10 mg total) by mouth daily. 90 tablet 0  . norethindrone-ethinyl estradiol (JUNEL FE 1/20) 1-20 MG-MCG tablet Take 1 tablet by mouth daily. 1 Package 0  . valACYclovir (VALTREX) 500 MG tablet Take one tablet (500 mg) by mouth daily for infection prevention.  Take one tablet (500 mg) by mouth twice a day for 3 days as needed. 100 tablet 3   No  current facility-administered medications for this visit.     Family History  Problem Relation Age of Onset  . Thyroid disease Mother   . Heart attack Maternal Grandfather   . Rashes / Skin problems Maternal Grandfather   . Cancer Paternal Grandmother        kidney  . Hypertension Father     Review of Systems  All other systems reviewed and are negative.   Exam:   BP 110/74   Pulse 80   Temp 97.9 F (36.6 C)   Resp 16   Ht 5' 4.5" (1.638 m)   Wt 140 lb 12.8 oz (63.9 kg)   LMP 10/18/2018 (Exact Date)   BMI 23.80 kg/m     General appearance: alert, cooperative and appears stated age Head: normocephalic, without obvious abnormality, atraumatic Neck: no adenopathy, supple, symmetrical, trachea midline and thyroid normal to inspection and palpation Lungs: clear to auscultation bilaterally Breasts: normal appearance, no masses or tenderness, No nipple retraction or dimpling, No nipple discharge or bleeding, No axillary adenopathy Heart: regular rate and rhythm Abdomen: soft, non-tender; no masses, no organomegaly Extremities: extremities normal, atraumatic, no cyanosis or edema Skin: skin color, texture, turgor normal. No rashes or lesions Lymph nodes: cervical, supraclavicular, and axillary nodes normal. Neurologic: grossly normal  Pelvic: External genitalia:  no lesions  No abnormal inguinal nodes palpated.              Urethra:  normal appearing urethra with no masses, tenderness or lesions              Bartholins and Skenes: normal                 Vagina: normal appearing vagina with normal color and discharge, no lesions              Cervix: no lesions.  Ectropion friable with pap.              Pap taken: Yes.   Bimanual Exam:  Uterus:  normal size, contour, position, consistency, mobility, non-tender. Feels anteverted and anteflexed.              Adnexa: no mass, fullness, tenderness           Chaperone was present for exam.  Assessment:   Well woman  visit with normal exam. 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   Anxiety.  Plan: Mammogram screening discussed. Self breast awareness reviewed. Pap as above. Guidelines for Calcium, Vitamin D, regular exercise program including cardiovascular and weight bearing exercise. We talked about long term benefits of taking COCs.  I will refill for one year.  Refill of Valtrex 500 mg.  Routine labs.  She will do flu vaccine at work.  Lexapro though PCP.  Follow up annually and prn.   After visit summary provided.

## 2018-10-26 ENCOUNTER — Other Ambulatory Visit (HOSPITAL_COMMUNITY)
Admission: RE | Admit: 2018-10-26 | Discharge: 2018-10-26 | Disposition: A | Discharge status: Home / Self Care | Payer: BC Managed Care – PPO | Source: Ambulatory Visit | Attending: Obstetrics and Gynecology | Admitting: Obstetrics and Gynecology

## 2018-10-26 ENCOUNTER — Ambulatory Visit (INDEPENDENT_AMBULATORY_CARE_PROVIDER_SITE_OTHER): Payer: BC Managed Care – PPO | Admitting: Obstetrics and Gynecology

## 2018-10-26 ENCOUNTER — Encounter: Payer: Self-pay | Admitting: Obstetrics and Gynecology

## 2018-10-26 ENCOUNTER — Other Ambulatory Visit: Payer: Self-pay

## 2018-10-26 VITALS — BP 110/74 | HR 80 | Temp 97.9°F | Resp 16 | Ht 64.5 in | Wt 140.8 lb

## 2018-10-26 DIAGNOSIS — Z01419 Encounter for gynecological examination (general) (routine) without abnormal findings: Secondary | ICD-10-CM

## 2018-10-26 DIAGNOSIS — R7989 Other specified abnormal findings of blood chemistry: Secondary | ICD-10-CM

## 2018-10-26 MED ORDER — VALACYCLOVIR HCL 500 MG PO TABS: ORAL_TABLET | ORAL | 3 refills | Status: DC

## 2018-10-26 MED ORDER — NORETHIN ACE-ETH ESTRAD-FE 1-20 MG-MCG PO TABS: 1.0000 | ORAL_TABLET | Freq: Every day | ORAL | 3 refills | Status: DC

## 2018-10-26 NOTE — Patient Instructions (Signed)
EXERCISE AND DIET:  We recommended that you start or continue a regular exercise program for good health. Regular exercise means any activity that makes your heart beat faster and makes you sweat.  We recommend exercising at least 30 minutes per day at least 3 days a week, preferably 4 or 5.  We also recommend a diet low in fat and sugar.  Inactivity, poor dietary choices and obesity can cause diabetes, heart attack, stroke, and kidney damage, among others.    ALCOHOL AND SMOKING:  Women should limit their alcohol intake to no more than 7 drinks/beers/glasses of wine (combined, not each!) per week. Moderation of alcohol intake to this level decreases your risk of breast cancer and liver damage. And of course, no recreational drugs are part of a healthy lifestyle.  And absolutely no smoking or even second hand smoke. Most people know smoking can cause heart and lung diseases, but did you know it also contributes to weakening of your bones? Aging of your skin?  Yellowing of your teeth and nails?  CALCIUM AND VITAMIN D:  Adequate intake of calcium and Vitamin D are recommended.  The recommendations for exact amounts of these supplements seem to change often, but generally speaking 600 mg of calcium (either carbonate or citrate) and 800 units of Vitamin D per day seems prudent. Certain women may benefit from higher intake of Vitamin D.  If you are among these women, your doctor will have told you during your visit.    PAP SMEARS:  Pap smears, to check for cervical cancer or precancers,  have traditionally been done yearly, although recent scientific advances have shown that most women can have pap smears less often.  However, every woman still should have a physical exam from her gynecologist every year. It will include a breast check, inspection of the vulva and vagina to check for abnormal growths or skin changes, a visual exam of the cervix, and then an exam to evaluate the size and shape of the uterus and  ovaries.  And after 25 years of age, a rectal exam is indicated to check for rectal cancers. We will also provide age appropriate advice regarding health maintenance, like when you should have certain vaccines, screening for sexually transmitted diseases, bone density testing, colonoscopy, mammograms, etc.   MAMMOGRAMS:  All women over 25 years old should have a yearly mammogram. Many facilities now offer a "3D" mammogram, which may cost around $50 extra out of pocket. If possible,  we recommend you accept the option to have the 3D mammogram performed.  It both reduces the number of women who will be called back for extra views which then turn out to be normal, and it is better than the routine mammogram at detecting truly abnormal areas.    COLONOSCOPY:  Colonoscopy to screen for [place] cancer is recommended for all women at age 25.  We know, you hate the idea of the prep.  We agree, BUT, having [place] cancer and not knowing it is worse!!  [place] cancer so often starts as a polyp that can be seen and removed at colonscopy, which can quite literally save your life!  And if your first colonoscopy is normal and you have no family history of [place] cancer, most women don't have to have it again for 10 years.  Once every ten years, you can do something that may end up saving your life, right?  We will be happy to help you get it scheduled when you are ready.    Be sure to check your insurance coverage so you understand how much it will cost.  It may be covered as a preventative service at no cost, but you should check your particular policy.      

## 2018-10-27 LAB — COMPREHENSIVE METABOLIC PANEL
ALT: 10 IU/L (ref 0–32)
AST: 18 IU/L (ref 0–40)
Albumin/Globulin Ratio: 1.8 (ref 1.2–2.2)
Albumin: 4.6 g/dL (ref 3.9–5.0)
Alkaline Phosphatase: 63 IU/L (ref 39–117)
BUN/Creatinine Ratio: 12 (ref 9–23)
BUN: 9 mg/dL (ref 6–20)
Bilirubin Total: 0.3 mg/dL (ref 0.0–1.2)
CO2: 23 mmol/L (ref 20–29)
Calcium: 9.4 mg/dL (ref 8.7–10.2)
Chloride: 104 mmol/L (ref 96–106)
Creatinine, Ser: 0.74 mg/dL (ref 0.57–1.00)
GFR calc Af Amer: 131 mL/min/{1.73_m2} (ref >59)
GFR calc non Af Amer: 114 mL/min/{1.73_m2} (ref >59)
Globulin, Total: 2.5 g/dL (ref 1.5–4.5)
Glucose: 91 mg/dL (ref 65–99)
Potassium: 4.6 mmol/L (ref 3.5–5.2)
Sodium: 142 mmol/L (ref 134–144)
Total Protein: 7.1 g/dL (ref 6.0–8.5)

## 2018-10-27 LAB — LIPID PANEL
Chol/HDL Ratio: 2.5 ratio (ref 0.0–4.4)
Cholesterol, Total: 166 mg/dL (ref 100–199)
HDL: 67 mg/dL (ref >39)
LDL Calculated: 85 mg/dL (ref 0–99)
Triglycerides: 72 mg/dL (ref 0–149)
VLDL Cholesterol Cal: 14 mg/dL (ref 5–40)

## 2018-10-27 LAB — TSH: TSH: 9.68 u[IU]/mL — ABNORMAL HIGH (ref 0.450–4.500)

## 2018-10-27 LAB — CBC
Hematocrit: 40.6 % (ref 34.0–46.6)
Hemoglobin: 13.6 g/dL (ref 11.1–15.9)
MCH: 30.8 pg (ref 26.6–33.0)
MCHC: 33.5 g/dL (ref 31.5–35.7)
MCV: 92 fL (ref 79–97)
Platelets: 236 10*3/uL (ref 150–450)
RBC: 4.42 x10E6/uL (ref 3.77–5.28)
RDW: 12.2 % (ref 11.7–15.4)
WBC: 4.3 10*3/uL (ref 3.4–10.8)

## 2018-10-27 LAB — CYTOLOGY - PAP: Diagnosis: NEGATIVE

## 2018-10-28 ENCOUNTER — Encounter: Payer: Self-pay | Admitting: Obstetrics and Gynecology

## 2018-10-28 ENCOUNTER — Ambulatory Visit: Payer: BC Managed Care – PPO | Admitting: Nurse Practitioner

## 2018-10-28 ENCOUNTER — Telehealth: Payer: Self-pay | Admitting: Obstetrics and Gynecology

## 2018-10-28 NOTE — Telephone Encounter (Signed)
Spoke with patient. Seen in office for AEX on 10/26/18. LMP 10/11/18. OCP for contraceptive. Patient states she had intercourse after her pap on 8/26 and experiencing some bleeding during intercourse. Denies bleeding or pain after.  Bleeding has resolved. Denies pain, fever/chills, N/V, or odor. No change in partners.   Advised not uncommon to have some spotting after PAP. Advised to continue to monitor, if reoccurs or new symptoms develop return call to office for OV. Advised I will review with Dr. Quincy Simmonds and return call if any additional recommendations. Patient requesting results form 8/26.   Dr. Quincy Simmonds -please review labs dated 10/26/18 and advise.

## 2018-10-28 NOTE — Telephone Encounter (Signed)
Weisenberger, SAHORY NORDLING [facility]  Phone Number: [phone]        Dr. Quincy Simmonds,   Is it normal to have a decent amount of bleeding with intercourse after a PAP? I was a little concerned about this.   Thanks!  Dana Welch

## 2018-10-28 NOTE — Telephone Encounter (Signed)
Thanks for the update.  I did release all of her results to her.  You may close the encounter.

## 2018-10-28 NOTE — Telephone Encounter (Signed)
Results viewed by Vasiliki E Nanni on 10/28/2018 12:04 PM   Encounter closed.

## 2018-10-29 LAB — SPECIMEN STATUS REPORT

## 2018-10-29 LAB — T4, FREE: Free T4: 1.04 ng/dL (ref 0.82–1.77)

## 2018-11-01 NOTE — Addendum Note (Signed)
Addended by: Yisroel Ramming, BROOK E on: 11/01/2018 11:42 AM   Modules accepted: Orders

## 2018-11-15 ENCOUNTER — Ambulatory Visit (INDEPENDENT_AMBULATORY_CARE_PROVIDER_SITE_OTHER): Payer: BC Managed Care – PPO | Admitting: Internal Medicine

## 2018-11-15 ENCOUNTER — Encounter: Payer: Self-pay | Admitting: Internal Medicine

## 2018-11-15 ENCOUNTER — Other Ambulatory Visit: Payer: Self-pay

## 2018-11-15 VITALS — BP 108/80 | HR 86 | Temp 97.6°F | Ht 64.5 in | Wt 142.0 lb

## 2018-11-15 DIAGNOSIS — F411 Generalized anxiety disorder: Secondary | ICD-10-CM | POA: Diagnosis not present

## 2018-11-15 DIAGNOSIS — Z0000 Encounter for general adult medical examination without abnormal findings: Secondary | ICD-10-CM | POA: Diagnosis not present

## 2018-11-15 MED ORDER — ESCITALOPRAM OXALATE 10 MG PO TABS: 10.0000 mg | ORAL_TABLET | Freq: Every day | ORAL | 3 refills | Status: DC

## 2018-11-15 NOTE — Progress Notes (Signed)
   Subjective:   Patient ID: Dana Welch, female    DOB: 1995-06-14, 23 y.o.   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   PMH, [facility], social history reviewed and updated  Review of Systems  Constitutional: Negative.   HENT: Negative.   Eyes: Negative.   Respiratory: Negative for cough, chest tightness and shortness of breath.   Cardiovascular: Negative for chest pain, palpitations and leg swelling.  Gastrointestinal: Negative for abdominal distention, abdominal pain, constipation, diarrhea, nausea and vomiting.  Musculoskeletal: Negative.   Skin: Negative.   Neurological: Negative.   Psychiatric/Behavioral: Negative.     Objective:  Physical Exam Constitutional:      Appearance: She is well-developed.  HENT:     Head: Normocephalic and atraumatic.  Neck:     Musculoskeletal: Normal range of motion.  Cardiovascular:     Rate and Rhythm: Normal rate and regular rhythm.  Pulmonary:     Effort: Pulmonary effort is normal. No respiratory distress.     Breath sounds: Normal breath sounds. No wheezing or rales.  Abdominal:     General: Bowel sounds are normal. There is no distension.     Palpations: Abdomen is soft.     Tenderness: There is no abdominal tenderness. There is no rebound.  Skin:    General: Skin is warm and dry.  Neurological:     Mental Status: She is alert and oriented to person, place, and time.     Coordination: Coordination normal.     Vitals:   11/15/18 0838  BP: 108/80  Pulse: 86  Temp: 97.6 F (36.4 C)  TempSrc: Oral  SpO2: 99%  Weight: 142 lb (64.4 kg)  Height: 5' 4.5" (1.638 m)    Assessment & Plan:

## 2018-11-15 NOTE — Assessment & Plan Note (Signed)
Doing well on lexapro 10 mg daily without side effects. Continue. Reinforced coping skills.

## 2018-11-15 NOTE — Patient Instructions (Signed)
Health Maintenance, Female Adopting a healthy lifestyle and getting preventive care are important in promoting health and wellness. Ask your health care provider about:  The right schedule for you to have regular tests and exams.  Things you can do on your own to prevent diseases and keep yourself healthy. What should I know about diet, weight, and exercise? Eat a healthy diet   Eat a diet that includes plenty of vegetables, fruits, low-fat dairy products, and lean protein.  Do not eat a lot of foods that are high in solid fats, added sugars, or sodium. Maintain a healthy weight Body mass index (BMI) is used to identify weight problems. It estimates body fat based on height and weight. Your health care provider can help determine your BMI and help you achieve or maintain a healthy weight. Get regular exercise Get regular exercise. This is one of the most important things you can do for your health. Most adults should:  Exercise for at least 150 minutes each week. The exercise should increase your heart rate and make you sweat (moderate-intensity exercise).  Do strengthening exercises at least twice a week. This is in addition to the moderate-intensity exercise.  Spend less time sitting. Even light physical activity can be beneficial. Watch cholesterol and blood lipids Have your blood tested for lipids and cholesterol at 25 years of age, then have this test every 5 years. Have your cholesterol levels checked more often if:  Your lipid or cholesterol levels are high.  You are older than 25 years of age.  You are at high risk for heart disease. What should I know about cancer screening? Depending on your health history and family history, you may need to have cancer screening at various ages. This may include screening for:  Breast cancer.  Cervical cancer.  Colorectal cancer.  Skin cancer.  Lung cancer. What should I know about heart disease, diabetes, and high blood  pressure? Blood pressure and heart disease  High blood pressure causes heart disease and increases the risk of stroke. This is more likely to develop in people who have high blood pressure readings, are of African descent, or are overweight.  Have your blood pressure checked: ? Every 3-5 years if you are 18-39 years of age. ? Every year if you are 40 years old or older. Diabetes Have regular diabetes screenings. This checks your fasting blood sugar level. Have the screening done:  Once every three years after age 40 if you are at a normal weight and have a low risk for diabetes.  More often and at a younger age if you are overweight or have a high risk for diabetes. What should I know about preventing infection? Hepatitis B If you have a higher risk for hepatitis B, you should be screened for this virus. Talk with your health care provider to find out if you are at risk for hepatitis B infection. Hepatitis C Testing is recommended for:  Everyone born from 1945 through 1965.  Anyone with known risk factors for hepatitis C. Sexually transmitted infections (STIs)  Get screened for STIs, including gonorrhea and chlamydia, if: ? You are sexually active and are younger than 24 years of age. ? You are older than 24 years of age and your health care provider tells you that you are at risk for this type of infection. ? Your sexual activity has changed since you were last screened, and you are at increased risk for chlamydia or gonorrhea. Ask your health care provider if   you are at risk.  Ask your health care provider about whether you are at high risk for HIV. Your health care provider may recommend a prescription medicine to help prevent HIV infection. If you choose to take medicine to prevent HIV, you should first get tested for HIV. You should then be tested every 3 months for as long as you are taking the medicine. Pregnancy  If you are about to stop having your period (premenopausal) and  you may become pregnant, seek counseling before you get pregnant.  Take 400 to 800 micrograms (mcg) of folic acid every day if you become pregnant.  Ask for birth control (contraception) if you want to prevent pregnancy. Osteoporosis and menopause Osteoporosis is a disease in which the bones lose minerals and strength with aging. This can result in bone fractures. If you are 65 years old or older, or if you are at risk for osteoporosis and fractures, ask your health care provider if you should:  Be screened for bone loss.  Take a calcium or vitamin D supplement to lower your risk of fractures.  Be given hormone replacement therapy (HRT) to treat symptoms of menopause. Follow these instructions at home: Lifestyle  Do not use any products that contain nicotine or tobacco, such as cigarettes, e-cigarettes, and chewing tobacco. If you need help quitting, ask your health care provider.  Do not use street drugs.  Do not share needles.  Ask your health care provider for help if you need support or information about quitting drugs. Alcohol use  Do not drink alcohol if: ? Your health care provider tells you not to drink. ? You are pregnant, may be pregnant, or are planning to become pregnant.  If you drink alcohol: ? Limit how much you use to 0-1 drink a day. ? Limit intake if you are breastfeeding.  Be aware of how much alcohol is in your drink. In the [place] one drink equals one 12 oz bottle of beer (355 mL), one 5 oz glass of wine (148 mL), or one 1 oz glass of hard liquor (44 mL). General instructions  Schedule regular health, dental, and eye exams.  Stay current with your vaccines.  Tell your health care provider if: ? You often feel depressed. ? You have ever been abused or do not feel safe at home. Summary  Adopting a healthy lifestyle and getting preventive care are important in promoting health and wellness.  Follow your health care provider's instructions about healthy  diet, exercising, and getting tested or screened for diseases.  Follow your health care provider's instructions on monitoring your cholesterol and blood pressure. This information is not intended to replace advice given to you by your health care provider. Make sure you discuss any questions you have with your health care provider. Document Released: 09/01/2010 Document Revised: 02/09/2018 Document Reviewed: 02/09/2018 Elsevier Patient Education  2020 Elsevier Inc.  

## 2018-11-15 NOTE — Assessment & Plan Note (Signed)
Flu shot at work soon. Tetanus up to date. Pap smear up to date. Counseled about sun safety and mole surveillance. Counseled about the dangers of distracted driving. Given 10 year screening recommendations.

## 2018-12-27 ENCOUNTER — Other Ambulatory Visit: Payer: Self-pay

## 2018-12-27 ENCOUNTER — Other Ambulatory Visit (INDEPENDENT_AMBULATORY_CARE_PROVIDER_SITE_OTHER): Payer: BC Managed Care – PPO

## 2018-12-27 DIAGNOSIS — R7989 Other specified abnormal findings of blood chemistry: Secondary | ICD-10-CM

## 2018-12-28 ENCOUNTER — Other Ambulatory Visit: Payer: Self-pay | Admitting: Obstetrics and Gynecology

## 2018-12-28 ENCOUNTER — Encounter: Payer: Self-pay | Admitting: Obstetrics and Gynecology

## 2018-12-28 ENCOUNTER — Encounter: Payer: Self-pay | Admitting: Internal Medicine

## 2018-12-28 DIAGNOSIS — R7989 Other specified abnormal findings of blood chemistry: Secondary | ICD-10-CM

## 2018-12-28 LAB — TSH: TSH: 7.84 u[IU]/mL — ABNORMAL HIGH (ref 0.450–4.500)

## 2018-12-28 LAB — T4, FREE: Free T4: 1.09 ng/dL (ref 0.82–1.77)

## 2018-12-28 NOTE — Progress Notes (Signed)
I will place orders for future TSH and free T4 due to subclinical hypothyroidism.  Treatment with Synthroid offered today and so far declined.  Will plan for at least testing in 6 months.

## 2019-01-06 ENCOUNTER — Encounter: Payer: Self-pay | Admitting: Obstetrics and Gynecology

## 2019-01-09 ENCOUNTER — Telehealth: Payer: Self-pay

## 2019-01-09 NOTE — Telephone Encounter (Signed)
Spoke with pt. Following up from [employer]. Pt states wanting to not take Synthroid at this time. Wants to get blood work taken again in 6 months per Dr Amelia Jo recommendations. OV scheduled May 5th at 8am.  Pt also states wanting to try and get pregnant in the next few months. Pt educated on what is recommended when off birth control before trying to conceive. Pt states understanding. Instructed to call back to Dr Quincy Simmonds in future if gets pregnant before May 2021 appt to check on thyroid labs. Pt agreeable.   Will route to Dr Quincy Simmonds for recommendations.please advise.     Welch, Dana E to Nunzio Cobbs, MD     01/06/19 1:12 PM Dr. Quincy Simmonds,  I have decided that I do not want to start the Synthroid right away. I would like to wait until my next bloodwork to see where my levels are.    I also had a quick question about my birth control. I am looking to come off of my birth control pills next month because my husband and I would like to try and start a family. Is there anything that I need to know or be aware of when coming off of my hormonal birth control?   Thank you!

## 2019-01-10 NOTE — Telephone Encounter (Signed)
Call to patient. Message given to patient as seen below from Dr. Quincy Simmonds and patient verbalized understanding. Preconception consult scheduled for 01-24-2019 at 0930. Patient agreeable to date and time of appointment.   Routing to provider and will close encounter.

## 2019-01-10 NOTE — Telephone Encounter (Signed)
Very exiting that she is planning for future pregnancy.  I do recommend optimizing her thyroid function prior to pregnancy.  Untreated hypothyroidism can be associated with increased risk of miscarriage.  We need to review all of her medications also.  I recommend an office visit for preconception counseling.

## 2019-01-23 NOTE — Progress Notes (Signed)
GYNECOLOGY  VISIT   HPI: 23 y.o.   Single  Caucasian  female   G0P0000 with Patient's last menstrual period was 01/03/2019 (exact date).   here for preconception consult.    Considering pregnancy in the next year.  She already tried this month.  She stopped her birth control last month.   Review thyroid testing. Her TSH is elevated and normal free T4.  She is on Lexapro for anxiety.  She used Effexor in the past and had palpitations.  She has not been Zoloft.   Uses Valtrex prn.   No herbal use.  Increasing her calcium intake.  No tobacco use, rare ETOH use, recreation drugs or narcotics.  No cats at home.  No plans for travel.   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   Did her flu vaccine.  Completed Gardasil.  TDap up to date, 2017.  No FH of birth defects.  Not [place] or [place] background.  No hx cystic fibrosis.  GYNECOLOGIC HISTORY: Patient's last menstrual period was 01/03/2019 (exact date). Contraception:  Nonew Menopausal hormone therapy:  n/a Last mammogram:  n/a Last pap smear: 10-26-18 Neg, 08-28-15 Neg        OB History    Gravida  0   Para  0   Term  0   Preterm  0   AB  0   Living  0     SAB  0   TAB  0   Ectopic  0   Multiple  0   Live Births                 Patient Active Problem List   Diagnosis Date Noted  . Routine general medical examination at a health care facility 11/15/2018  . Allergic rhinitis 02/24/2017  . Generalized anxiety disorder 11/23/2014  . ADHD (attention deficit hyperactivity disorder) 11/23/2014    Past Medical History:  Diagnosis Date  . ADHD (attention deficit hyperactivity disorder)   . Anxiety   . Dysmenorrhea   . Elevated TSH   . HSV-1 infection   . UTI (lower urinary tract infection)     Past Surgical History:  Procedure Laterality Date  . WISDOM TOOTH EXTRACTION      Current Outpatient Medications  Medication Sig Dispense Refill  . escitalopram  (LEXAPRO) 10 MG tablet Take 1 tablet (10 mg total) by mouth daily. 90 tablet 3  . valACYclovir (VALTREX) 500 MG tablet Take one tablet (500 mg) by mouth daily for infection prevention.  Take one tablet (500 mg) by mouth twice a day for 3 days as needed. 100 tablet 3  . levothyroxine (SYNTHROID) 25 MCG tablet Take 2 tablets (50 mcg total) by mouth daily. One po qd 30 tablet 5   No current facility-administered medications for this visit.      ALLERGIES: Patient has no known allergies.  Family History  Problem Relation Age of Onset  . Thyroid disease Mother   . Heart attack Maternal Grandfather   . Rashes / Skin problems Maternal Grandfather   . Cancer Paternal Grandmother        kidney  . Hypertension Father     Social History   Socioeconomic History  . Marital status: Single    Spouse name: Not on file  . Number of children: 0  . Years of education: 4  . Highest education level: Not on file  Occupational History  . 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 Financial resource strain: Not on file  . Food insecurity    Worry: Not on file    Inability: Not on file  . Transportation needs    Medical: Not on file    Non-medical: Not on file  Tobacco Use  . Smoking status: Never Smoker  . Smokeless tobacco: Never Used  Substance and Sexual Activity  . Alcohol use: Yes    Comment: occ.  . Drug use: No  . Sexual activity: Yes    Partners: Male    Birth control/protection: Pill    Comment: Junel fe  Lifestyle  . Physical activity    Days per week: Not on file    Minutes per session: Not on file  . Stress: Not on file  Relationships  . Social Herbalist on phone: Not on file    Gets together: Not on file    Attends religious service: Not on file    Active member of club or organization: Not on file    Attends meetings of clubs or organizations: Not on file    Relationship status: Not on file  . Intimate partner violence     Fear of current or ex partner: Not on file    Emotionally abused: Not on file    Physically abused: Not on file    Forced sexual activity: Not on file  Other Topics Concern  . Not on file  Social History Narrative   Fun: Exercise, golf, be at home.    Denies abuse and feels safe at home.     Review of Systems  All other systems reviewed and are negative.   PHYSICAL EXAMINATION:    BP 112/64   Pulse 76   Temp 98 F (36.7 C) (Temporal)   Ht 5' 4.5" (1.638 m)   Wt 142 lb (64.4 kg)   LMP 01/03/2019 (Exact Date)   BMI 24.00 kg/m     General appearance: alert, cooperative and appears stated age   ASSESSMENT  Subclinical hypothyroidism.  Preconception care.  Anxiety.  On Lexapro. Hx genital HSV 1.   PLAN  Start Synthroid 25 mcg daily.  PNV.  We reviewed Lexapro in Up to Date. She will continue with this based on no increase risk of congenital anomalies.  I did discuss increased risk of postpartum hemorrhage. Discussed food recommendations, avoidance of exposures, exercise in pregnancy.  Will check Rubella titer.  Timed intercourse discussed.  What to Expect Before Expecting recommended.  Fu in 6 weeks for lab work and a recheck.   An After Visit Summary was printed and given to the patient.  ___25___ minutes face to face time of which over 50% was spent in counseling.

## 2019-01-24 ENCOUNTER — Ambulatory Visit: Payer: BC Managed Care – PPO | Admitting: Obstetrics and Gynecology

## 2019-01-24 ENCOUNTER — Other Ambulatory Visit: Payer: Self-pay

## 2019-01-24 ENCOUNTER — Encounter: Payer: Self-pay | Admitting: Obstetrics and Gynecology

## 2019-01-24 VITALS — BP 112/64 | HR 76 | Temp 98.0°F | Ht 64.5 in | Wt 142.0 lb

## 2019-01-24 DIAGNOSIS — R7989 Other specified abnormal findings of blood chemistry: Secondary | ICD-10-CM

## 2019-01-24 DIAGNOSIS — Z3169 Encounter for other general counseling and advice on procreation: Secondary | ICD-10-CM | POA: Diagnosis not present

## 2019-01-24 MED ORDER — LEVOTHYROXINE SODIUM 25 MCG PO TABS: 50.0000 ug | ORAL_TABLET | Freq: Every day | ORAL | 5 refills | Status: DC

## 2019-01-25 LAB — RUBELLA SCREEN: Rubella Antibodies, IGG: 2.78 index (ref >0.99)

## 2019-02-26 ENCOUNTER — Encounter: Payer: Self-pay | Admitting: Obstetrics and Gynecology

## 2019-02-27 ENCOUNTER — Telehealth: Payer: Self-pay | Admitting: Obstetrics and Gynecology

## 2019-02-27 NOTE — Telephone Encounter (Signed)
Patient is returning call to [place], [place].

## 2019-02-27 NOTE — Telephone Encounter (Signed)
Left message for pt to call back to triage RN.  

## 2019-02-27 NOTE — Telephone Encounter (Signed)
Spoke to pt. Pt wanting to know recommendation on caffeine while trying to conceive. Pt advised can do 1 cup of coffee or 1 soda a day with 300mg  or less of caffeine. Pt agreeable and verbalized understanding. Pt states cut out caffeine completely and had bad headaches, was only doing 1 cup of coffee/day. Encouraged pt to only have 1 cup of coffee a day and  drink water and take PNV. Pt agreeable.   Will route to Dr Quincy Simmonds for review and if any additional recommendations.

## 2019-02-27 NOTE — Telephone Encounter (Signed)
Patient sent the following message through [place]. Routing to triage to assist patient with request.  [place], [place] [facility]  Phone Number: [phone]  Dr. Quincy Simmonds,   How much caffeine is safe to drink while trying to conceive? I have hear some tell me none at all and some tell me it is fine.   Thanks!

## 2019-02-27 NOTE — Telephone Encounter (Signed)
Spoke with patient, advised per Dr. Silva. Patient verbalizes understanding and is agreeable.  Encounter closed.  

## 2019-02-27 NOTE — Telephone Encounter (Signed)
ACOG guidelines recommend keeping intake of caffeine under 200 mcg per day.

## 2019-03-03 NOTE — L&D Delivery Note (Signed)
Delivery Note Began pushing with patient at about 41. At 9:58 PM a viable and healthy female was delivered via Vaginal, Spontaneous (Presentation: Right Occiput Anterior).  APGAR: 7, 8; weight - see infant's chart Placenta status: Spontaneous, Intact.  Cord: 3 vessels with the following complications: None.  Cord pH: collected  Anesthesia: Epidural Episiotomy: None Lacerations: Periurethral, R labium majus (superficial);2nd degree Suture Repair: 4-0 Vicryl for perirutethral laceration and right superficial labium majus laceration, and 3-0 Vicryl for second degree laceration Quantitative Blood Loss (mL):  517cc  Mom to postpartum.  Baby to Couplet care / Skin to Skin.  Steva Ready 01/27/2020, 10:48 PM

## 2019-03-08 ENCOUNTER — Ambulatory Visit: Payer: PRIVATE HEALTH INSURANCE | Admitting: Obstetrics and Gynecology

## 2019-03-08 ENCOUNTER — Encounter: Payer: Self-pay | Admitting: Obstetrics and Gynecology

## 2019-03-08 ENCOUNTER — Other Ambulatory Visit: Payer: Self-pay

## 2019-03-08 VITALS — BP 118/78 | HR 84 | Temp 97.3°F | Ht 64.5 in | Wt 143.6 lb

## 2019-03-08 DIAGNOSIS — R7989 Other specified abnormal findings of blood chemistry: Secondary | ICD-10-CM | POA: Diagnosis not present

## 2019-03-08 DIAGNOSIS — E039 Hypothyroidism, unspecified: Secondary | ICD-10-CM | POA: Diagnosis not present

## 2019-03-08 NOTE — Progress Notes (Signed)
GYNECOLOGY  VISIT   HPI: 24 y.o.   Single  Caucasian  female   G0P0000 with Patient's last menstrual period was 03/01/2019.   here for medication follow up.  She started Synthroid 25 mcg (2 tablets) and is taking for elevated TSH.  She is currently taking one in the am an one in the pm.  No change in how she feels per patient.  She has felt some heart palpitations a handful of times.  Taking her medication 2 hours after meals.   She is planning on pregnancy.  She is Rubella immune.   Since she stopped her birth control, she is not having sweating and not having vivid dreams. Her first period occurred 28 days after her last period.  Her next cycle stretched out to 31 days and lasted longer.  She did receive her flu vaccine.   GYNECOLOGIC HISTORY: Patient's last menstrual period was 03/01/2019. Contraception:  None Menopausal hormone therapy:  n/a Last mammogram:  n/a Last pap smear:  10-26-18 Neg,08-28-15 Neg        OB History    Gravida  0   Para  0   Term  0   Preterm  0   AB  0   Living  0     SAB  0   TAB  0   Ectopic  0   Multiple  0   Live Births                 Patient Active Problem List   Diagnosis Date Noted  . Routine general medical examination at a health care facility 11/15/2018  . Allergic rhinitis 02/24/2017  . Generalized anxiety disorder 11/23/2014  . ADHD (attention deficit hyperactivity disorder) 11/23/2014    Past Medical History:  Diagnosis Date  . ADHD (attention deficit hyperactivity disorder)   . Anxiety   . Dysmenorrhea   . Elevated TSH   . HSV-1 infection   . UTI (lower urinary tract infection)     Past Surgical History:  Procedure Laterality Date  . WISDOM TOOTH EXTRACTION      Current Outpatient Medications  Medication Sig Dispense Refill  . escitalopram (LEXAPRO) 10 MG tablet Take 1 tablet (10 mg total) by mouth daily. 90 tablet 3  . levothyroxine (SYNTHROID) 25 MCG tablet Take 2 tablets (50 mcg total) by  mouth daily. One po qd 30 tablet 5  . valACYclovir (VALTREX) 500 MG tablet Take one tablet (500 mg) by mouth daily for infection prevention.  Take one tablet (500 mg) by mouth twice a day for 3 days as needed. 100 tablet 3   No current facility-administered medications for this visit.     ALLERGIES: Patient has no known allergies.  Family History  Problem Relation Age of Onset  . Thyroid disease Mother   . Heart attack Maternal Grandfather   . Rashes / Skin problems Maternal Grandfather   . Cancer Paternal Grandmother        kidney  . Hypertension Father     Social History   Socioeconomic History  . Marital status: Single    Spouse name: Not on file  . Number of children: 0  . Years of education: 12  . Highest education level: Not on file  Occupational History  . Occupation: Ship broker    Comment: Camera operator  Tobacco Use  . Smoking status: Never Smoker  . Smokeless tobacco: Never Used  Substance and Sexual Activity  . Alcohol use: Yes  Comment: occ.  . Drug use: No  . Sexual activity: Yes    Partners: Male    Birth control/protection: Pill    Comment: Junel fe  Other Topics Concern  . Not on file  Social History Narrative   Fun: Exercise, golf, be at home.    Denies abuse and feels safe at home.    Social Determinants of Health   Financial Resource Strain:   . Difficulty of Paying Living Expenses: Not on file  Food Insecurity:   . Worried About Programme researcher, broadcasting/film/video in the Last Year: Not on file  . Ran Out of Food in the Last Year: Not on file  Transportation Needs:   . Lack of Transportation (Medical): Not on file  . Lack of Transportation (Non-Medical): Not on file  Physical Activity:   . Days of Exercise per Week: Not on file  . Minutes of Exercise per Session: Not on file  Stress:   . Feeling of Stress : Not on file  Social Connections:   . Frequency of Communication with Friends and Family: Not on file  . Frequency of Social Gatherings  with Friends and Family: Not on file  . Attends Religious Services: Not on file  . Active Member of Clubs or Organizations: Not on file  . Attends Banker Meetings: Not on file  . Marital Status: Not on file  Intimate Partner Violence:   . Fear of Current or Ex-Partner: Not on file  . Emotionally Abused: Not on file  . Physically Abused: Not on file  . Sexually Abused: Not on file    Review of Systems  All other systems reviewed and are negative.   PHYSICAL EXAMINATION:    BP 118/78   Pulse 84   Temp (!) 97.3 F (36.3 C) (Temporal)   Ht 5' 4.5" (1.638 m)   Wt 143 lb 9.6 oz (65.1 kg)   LMP 03/01/2019   BMI 24.27 kg/m     General appearance: alert, cooperative and appears stated age   ASSESSMENT  Hypothyroidism.  Recent start on Synthroid.  Desire for future pregnancy.   PLAN  We discussed signs and symptoms of overactive thyroid and taking too much medication.  Will check TSH and free T4 today.  If she will continue on Synthroid 50 mcg daily, will rewrite for one pill of 50 mcg instead of 2 pills of 25 mcg. We reviewed the need for monitoring thyroid during pregnancy.  Continue PNV. We discussed her fertile time of her cycle and reviewed avoidance of water based lubricants.    An After Visit Summary was printed and given to the patient.  __14____ minutes consultation.

## 2019-03-09 LAB — TSH: TSH: 3.55 u[IU]/mL (ref 0.450–4.500)

## 2019-03-09 LAB — T4, FREE: Free T4: 1.37 ng/dL (ref 0.82–1.77)

## 2019-03-09 MED ORDER — LEVOTHYROXINE SODIUM 50 MCG PO TABS: 50.0000 ug | ORAL_TABLET | Freq: Every day | ORAL | 3 refills | Status: DC

## 2019-03-09 NOTE — Addendum Note (Signed)
Addended by: Ardell Isaacs, Debbe Bales E on: 03/09/2019 10:34 AM   Modules accepted: Orders

## 2019-04-17 ENCOUNTER — Encounter: Payer: Self-pay | Admitting: Obstetrics and Gynecology

## 2019-04-17 ENCOUNTER — Telehealth: Payer: Self-pay | Admitting: Obstetrics and Gynecology

## 2019-04-17 NOTE — Telephone Encounter (Signed)
Left message for pt to call back to Athenia Rys, RN in triage. 

## 2019-04-17 NOTE — Telephone Encounter (Signed)
Spoke to pt. After speaking with Dr Edward Jolly. Pt instructed not to take melatonin while trying to conceive or pregnancy. Pt agreeable and verbalized understanding.   Routing to Dr Edward Jolly for review and will close encounter.

## 2019-04-17 NOTE — Telephone Encounter (Signed)
Patient sent the following message through MyChart. Routing to triage to assist patient with request.  Farrel, TIARI ANDRINGA [facility] Pool  Phone Number: [phone]  Dr. Edward Jolly,   Is melatonin safe while trying to conceive? Every now and then, I will take a 10mg  melatonin dissolvable tablet before bed.    Thanks!  Marche Pyle

## 2019-04-29 ENCOUNTER — Encounter: Payer: Self-pay | Admitting: Obstetrics and Gynecology

## 2019-05-01 ENCOUNTER — Telehealth: Payer: Self-pay | Admitting: Obstetrics and Gynecology

## 2019-05-01 NOTE — Telephone Encounter (Signed)
Left message to call Corliss Coggeshall, RN at [facility] [phone].   

## 2019-05-01 NOTE — Telephone Encounter (Signed)
Spoke with patient. Patient reports symptoms have since resolved. MyChart message was sent on Saturday, 2/27. Patient reports tenderness in left pelvic area on Sunday, this has since resolved. Menses is now normal flow. Negative UPT x2, last one taken on 2/27. Denies fever/chills, N/V, pain. Reviewed option of continue to monitor, if symptoms return , OV needed for further evaluation. Also offered OV. Patient request OV, patient declined OV for today due to work schedule, scheduled for 3/2 at 9:30am with Dr. Edward Jolly. Advised patient I will forward to Dr. Edward Jolly to review, our office will return call if any additional recommendations. Patient verbalizes understanding and is agreeable.   Routing to provider for final review. Patient is agreeable to disposition. Will close encounter.

## 2019-05-01 NOTE — Telephone Encounter (Signed)
Patient sent the following correspondence through MyChart.  Dr. Edward Jolly,  About two days ago, I began having some light spotting that last about 48 hours. I took several pregnancy tests just out of curiosity and they were all negative. My cycle is supposed to start this coming Monday but began to bleed more heavily today. Today, I went for a jog and began to have intense cramping. I began to feel light headed and dizzy. Whenever I got home, I laid down and felt back to normal within minutes. I am very confused as to why I had such intense pain in such a short period of time.   Dana Welch

## 2019-05-01 NOTE — Progress Notes (Signed)
GYNECOLOGY  VISIT   HPI: 24 y.o.   Married  Caucasian  female   G0P0000 with Patient's last menstrual period was 04/29/2019 (exact date).   here for pelvic pain which began Saturday for 3 days. It was worse on LLQ. She had spotting a few days the week prior to menses.  Her bleeding started  4- 5 days earlier than usual.  States she developed significant cramping while out jogging.  The pain was very sudden onset.  The pain went away when she returned home to lay down.  LLQ pain is now resolved today.   She states had 3 neg home UPTs.  Off birth control for 5 months.  Her menses have been manageable since stopping OCPs.   No nausea, diarrhea, constipation, dysuria, or hematuria.   She is taking her Synthroid.   UPT:  negative  GYNECOLOGIC HISTORY: Patient's last menstrual period was 04/29/2019 (exact date). Contraception: None Menopausal hormone therapy:  n/a Last mammogram:  n/a Last pap smear: 10-26-18 Neg,08-28-15 Neg        OB History    Gravida  0   Para  0   Term  0   Preterm  0   AB  0   Living  0     SAB  0   TAB  0   Ectopic  0   Multiple  0   Live Births                 Patient Active Problem List   Diagnosis Date Noted  . Routine general medical examination at a health care facility 11/15/2018  . Allergic rhinitis 02/24/2017  . Generalized anxiety disorder 11/23/2014  . ADHD (attention deficit hyperactivity disorder) 11/23/2014    Past Medical History:  Diagnosis Date  . ADHD (attention deficit hyperactivity disorder)   . Anxiety   . Dysmenorrhea   . Elevated TSH   . HSV-1 infection   . UTI (lower urinary tract infection)     Past Surgical History:  Procedure Laterality Date  . WISDOM TOOTH EXTRACTION      Current Outpatient Medications  Medication Sig Dispense Refill  . escitalopram (LEXAPRO) 10 MG tablet Take 1 tablet (10 mg total) by mouth daily. 90 tablet 3  . levothyroxine (SYNTHROID) 50 MCG tablet Take 1 tablet (50  mcg total) by mouth daily. Take at least 30 minutes prior to eating breakfast. 90 tablet 3  . valACYclovir (VALTREX) 500 MG tablet Take one tablet (500 mg) by mouth daily for infection prevention.  Take one tablet (500 mg) by mouth twice a day for 3 days as needed. 100 tablet 3   No current facility-administered medications for this visit.     ALLERGIES: Patient has no known allergies.  Family History  Problem Relation Age of Onset  . Thyroid disease Mother   . Heart attack Maternal Grandfather   . Rashes / Skin problems Maternal Grandfather   . Cancer Paternal Grandmother        kidney  . Hypertension Father     Social History   Socioeconomic History  . Marital status: Married    Spouse name: Not on file  . Number of children: 0  . Years of education: 79  . Highest education level: Not on file  Occupational History  . Occupation: Ship broker    Comment: Camera operator  Tobacco Use  . Smoking status: Never Smoker  . Smokeless tobacco: Never Used  Substance and Sexual Activity  .  Alcohol use: Yes    Comment: occ.  . Drug use: No  . Sexual activity: Yes    Partners: Male    Birth control/protection: Pill    Comment: Junel fe  Other Topics Concern  . Not on file  Social History Narrative   Fun: Exercise, golf, be at home.    Denies abuse and feels safe at home.    Social Determinants of Health   Financial Resource Strain:   . Difficulty of Paying Living Expenses: Not on file  Food Insecurity:   . Worried About Programme researcher, broadcasting/film/video in the Last Year: Not on file  . Ran Out of Food in the Last Year: Not on file  Transportation Needs:   . Lack of Transportation (Medical): Not on file  . Lack of Transportation (Non-Medical): Not on file  Physical Activity:   . Days of Exercise per Week: Not on file  . Minutes of Exercise per Session: Not on file  Stress:   . Feeling of Stress : Not on file  Social Connections:   . Frequency of Communication with Friends and  Family: Not on file  . Frequency of Social Gatherings with Friends and Family: Not on file  . Attends Religious Services: Not on file  . Active Member of Clubs or Organizations: Not on file  . Attends Banker Meetings: Not on file  . Marital Status: Not on file  Intimate Partner Violence:   . Fear of Current or Ex-Partner: Not on file  . Emotionally Abused: Not on file  . Physically Abused: Not on file  . Sexually Abused: Not on file    Review of Systems  Genitourinary: Positive for pelvic pain (worse on LLQ).  All other systems reviewed and are negative.   PHYSICAL EXAMINATION:    BP 118/68   Pulse 76   Temp (!) 97.2 F (36.2 C) (Temporal)   Ht 5' 4.5" (1.638 m)   Wt 141 lb (64 kg)   LMP 04/29/2019 (Exact Date)   BMI 23.83 kg/m     General appearance: alert, cooperative and appears stated age    Pelvic: External genitalia:  no lesions              Urethra:  normal appearing urethra with no masses, tenderness or lesions              Bartholins and Skenes: normal                 Vagina: normal appearing vagina with normal color and discharge, no lesions              Cervix: no lesions                Bimanual Exam:  Uterus:  normal size, contour, position, consistency, mobility, non-tender              Adnexa: no mass, fullness, tenderness              Rectal exam: Yes.  .  Confirms.              Anus:  normal sphincter tone, no lesions  Chaperone was present for exam.  ASSESSMENT  LLQ pain and pelvic pain.  Probable rupture ovarian cyst.  Desire for fertility.  Hypothyroidism.   PLAN  Discussion of pelvic pain and ovarian cyst rupture.  Ibuprofen for pain.  Return for recurrent pain.  Would then do ultrasound.  Ok to keep doing exercise! Keep appointment  in May for follow up of thyroid levels.   An After Visit Summary was printed and given to the patient.  ___15___ minutes face to face time of which over 50% was spent in counseling.

## 2019-05-02 ENCOUNTER — Other Ambulatory Visit: Payer: Self-pay

## 2019-05-02 ENCOUNTER — Encounter: Payer: Self-pay | Admitting: Obstetrics and Gynecology

## 2019-05-02 ENCOUNTER — Ambulatory Visit (INDEPENDENT_AMBULATORY_CARE_PROVIDER_SITE_OTHER): Payer: PRIVATE HEALTH INSURANCE | Admitting: Obstetrics and Gynecology

## 2019-05-02 VITALS — BP 118/68 | HR 76 | Temp 97.2°F | Ht 64.5 in | Wt 141.0 lb

## 2019-05-02 DIAGNOSIS — R102 Pelvic and perineal pain: Secondary | ICD-10-CM

## 2019-05-02 DIAGNOSIS — R1032 Left lower quadrant pain: Secondary | ICD-10-CM

## 2019-05-02 LAB — POCT URINE PREGNANCY: Preg Test, Ur: NEGATIVE

## 2019-05-15 ENCOUNTER — Encounter: Payer: Self-pay | Admitting: Certified Nurse Midwife

## 2019-05-17 ENCOUNTER — Encounter: Payer: Self-pay | Admitting: Certified Nurse Midwife

## 2019-05-29 ENCOUNTER — Telehealth: Payer: Self-pay

## 2019-05-29 NOTE — Telephone Encounter (Signed)
Congratulations on the pregnancy! I will see her for an office visit and we can then determine any necessary labs to be done.

## 2019-05-29 NOTE — Telephone Encounter (Signed)
Spoke to pt. Pt states having 3 +UPTs at home. Pt states LMP 04/29/2019 which makes pt [redacted] weeks gestation today. Pt wanting to come and talk with Dr Edward Jolly and possibly get labs drawn at OV. OV scheduled per pts request of am appt on 06/07/2019 at 8 am. Pt agreeable. Will review with Dr Edward Jolly and will return call to pt with any additional recommendations. Pt verbalized understanding.   Routing to Dr Edward Jolly for recommendations.

## 2019-05-29 NOTE — Telephone Encounter (Signed)
Patient called regarding positive over the counter pregnancy test and patient has questions.

## 2019-05-29 NOTE — Telephone Encounter (Signed)
Left detailed message for pt. Pt to return call to office if any questions or concerns.  Routing to Dr Edward Jolly and will close encounter.

## 2019-06-06 NOTE — Progress Notes (Signed)
GYNECOLOGY  VISIT   HPI: 24 y.o.   Married  Caucasian  female   G0P0000 with Patient's last menstrual period was 04/29/2019 (exact date).   here for pregnancy confirmation.     No bleeding or pain.  She spotting with sex one week ago.  Stopped right away.   Some bouts of nausea.   Mild cramping on the left side, if she had to choose a side.   Taking PNV.   UPT:  positive  GYNECOLOGIC HISTORY: Patient's last menstrual period was 04/29/2019 (exact date). Contraception:  none Menopausal hormone therapy:  n/a Last mammogram:  n/a Last pap smear: 10-26-18 Neg,08-28-15 Neg        OB History    Gravida  0   Para  0   Term  0   Preterm  0   AB  0   Living  0     SAB  0   TAB  0   Ectopic  0   Multiple  0   Live Births                 Patient Active Problem List   Diagnosis Date Noted  . Routine general medical examination at a health care facility 11/15/2018  . Allergic rhinitis 02/24/2017  . Generalized anxiety disorder 11/23/2014  . ADHD (attention deficit hyperactivity disorder) 11/23/2014    Past Medical History:  Diagnosis Date  . ADHD (attention deficit hyperactivity disorder)   . Anxiety   . Dysmenorrhea   . Elevated TSH   . HSV-1 infection   . UTI (lower urinary tract infection)     Past Surgical History:  Procedure Laterality Date  . WISDOM TOOTH EXTRACTION      Current Outpatient Medications  Medication Sig Dispense Refill  . escitalopram (LEXAPRO) 10 MG tablet Take 1 tablet (10 mg total) by mouth daily. 90 tablet 3  . levothyroxine (SYNTHROID) 50 MCG tablet Take 1 tablet (50 mcg total) by mouth daily. Take at least 30 minutes prior to eating breakfast. 90 tablet 3  . Prenatal Vit-Fe Fumarate-FA (PRENATAL VITAMINS PO) Take 1 tablet by mouth daily.    . valACYclovir (VALTREX) 500 MG tablet Take one tablet (500 mg) by mouth daily for infection prevention.  Take one tablet (500 mg) by mouth twice a day for 3 days as needed. 100 tablet 3    No current facility-administered medications for this visit.     ALLERGIES: Patient has no known allergies.  Family History  Problem Relation Age of Onset  . Thyroid disease Mother   . Heart attack Maternal Grandfather   . Rashes / Skin problems Maternal Grandfather   . Cancer Paternal Grandmother        kidney  . Hypertension Father     Social History   Socioeconomic History  . Marital status: Married    Spouse name: Not on file  . Number of children: 0  . Years of education: 81  . Highest education level: Not on file  Occupational History  . Occupation: Ship broker    Comment: Camera operator  Tobacco Use  . Smoking status: Never Smoker  . Smokeless tobacco: Never Used  Substance and Sexual Activity  . Alcohol use: Yes    Comment: occ.  . Drug use: No  . Sexual activity: Yes    Partners: Male    Birth control/protection: Pill    Comment: Junel fe  Other Topics Concern  . Not on file  Social History  Narrative   Fun: Exercise, golf, be at home.    Denies abuse and feels safe at home.    Social Determinants of Health   Financial Resource Strain:   . Difficulty of Paying Living Expenses:   Food Insecurity:   . Worried About Programme researcher, broadcasting/film/video in the Last Year:   . Barista in the Last Year:   Transportation Needs:   . Freight forwarder (Medical):   Marland Kitchen Lack of Transportation (Non-Medical):   Physical Activity:   . Days of Exercise per Week:   . Minutes of Exercise per Session:   Stress:   . Feeling of Stress :   Social Connections:   . Frequency of Communication with Friends and Family:   . Frequency of Social Gatherings with Friends and Family:   . Attends Religious Services:   . Active Member of Clubs or Organizations:   . Attends Banker Meetings:   Marland Kitchen Marital Status:   Intimate Partner Violence:   . Fear of Current or Ex-Partner:   . Emotionally Abused:   Marland Kitchen Physically Abused:   . Sexually Abused:     Review of  Systems  All other systems reviewed and are negative.   PHYSICAL EXAMINATION:    BP 128/74   Pulse 100   Temp 98.8 F (37.1 C) (Temporal)   Ht 5' 4.5" (1.638 m)   Wt 141 lb 12.8 oz (64.3 kg)   LMP 04/29/2019 (Exact Date)   BMI 23.96 kg/m     General appearance: alert, cooperative and appears stated age   Pelvic: External genitalia:  no lesions              Urethra:  normal appearing urethra with no masses, tenderness or lesions              Bartholins and Skenes: normal                 Vagina: normal appearing vagina with normal color and discharge, no lesions              Cervix: no lesions                Bimanual Exam:  Uterus:  5 -6 week size, nontender.              Adnexa: no mass, fullness, tenderness       Chaperone was present for exam.  ASSESSMENT  5+[redacted] weeks gestation with [facility] Feb 03, 2020. I feel uterine growth.   PLAN  We discussed early pregnancy do's and don'ts. Exercise, sex, food, hydration, avoidance of exposures.  Will check hCG due to her cramping.  Anticipate OB US next week.  List of OB providers.    An After Visit Summary was printed and given to the patient.  ___20___ minutes face to face time of which over 50% was spent in counseling.

## 2019-06-07 ENCOUNTER — Ambulatory Visit (INDEPENDENT_AMBULATORY_CARE_PROVIDER_SITE_OTHER): Payer: PRIVATE HEALTH INSURANCE | Admitting: Obstetrics and Gynecology

## 2019-06-07 ENCOUNTER — Telehealth: Payer: Self-pay

## 2019-06-07 ENCOUNTER — Encounter: Payer: Self-pay | Admitting: Obstetrics and Gynecology

## 2019-06-07 ENCOUNTER — Other Ambulatory Visit: Payer: Self-pay

## 2019-06-07 VITALS — BP 128/74 | HR 100 | Temp 98.8°F | Ht 64.5 in | Wt 141.8 lb

## 2019-06-07 DIAGNOSIS — N926 Irregular menstruation, unspecified: Secondary | ICD-10-CM

## 2019-06-07 DIAGNOSIS — Z3201 Encounter for pregnancy test, result positive: Secondary | ICD-10-CM | POA: Diagnosis not present

## 2019-06-07 DIAGNOSIS — Z3490 Encounter for supervision of normal pregnancy, unspecified, unspecified trimester: Secondary | ICD-10-CM

## 2019-06-07 LAB — BETA HCG QUANT (REF LAB): hCG Quant: 15623 m[IU]/mL

## 2019-06-07 LAB — POCT URINE PREGNANCY: Preg Test, Ur: POSITIVE — AB

## 2019-06-07 NOTE — Telephone Encounter (Signed)
Spoke with pt. Pt given results of BhcG. Pt verbalized understanding. Pt scheduled for viability PUS on 06/22/2019 with Dr Edward Jolly per pt's request for am appt. Pt declines earlier appt for PUS. Pt agreeable to date and time. Pt given precautions for bleeding and pain. Pt verbalized understanding. Pt mentioned she was going to call to get OB appt soon.   Routing to Dr Edward Jolly for review and any additional recommendations.  Cc: Hedda Slade for precert. Future orders placed.

## 2019-06-08 NOTE — Telephone Encounter (Signed)
I agree with the OB ultrasound.  Her hCG level is excellent. You may close the encounter.

## 2019-06-12 ENCOUNTER — Telehealth: Payer: Self-pay

## 2019-06-12 NOTE — Telephone Encounter (Signed)
Patient called to speak with nurse regarding advice for over the counter medications that are okay to take for nausea during pregnancy.

## 2019-06-12 NOTE — Telephone Encounter (Signed)
Spoke with pt. Pt reports having nausea only in evenings to where she cannot eat dinner. Pt denies having vomiting, fever, chills, etc. Pt is around 6+[redacted] weeks pregnant. Has OB appt scheduled with Dr Richardson Dopp at [facility] on 07/13/2019. Has viability PUS with Dr Edward Jolly on 06/22/2019.  Pt denies using any OTC treatments for nausea.Pt wanting to use Vit B6 and Unisom, but wanted to ask Dr Edward Jolly first.  Will discuss with another provider since Dr Edward Jolly out of officefor alt recommendations/advice and will return call to pt. Pt agreeable.   Routing to Dr Oscar La.

## 2019-06-12 NOTE — Telephone Encounter (Signed)
Spoke with Dr Oscar La. Pt instructed can use 12.5mg  of Unisom and 25 mg of Vit B 6 for nausea and to eat small bland meals throughout day and stay well hydrated. If uncontrollable vomiting occurs and cant eat or drink, then go to MAU for evaluation.   Spoke with pt. Pt given above recommendations. Pt agreeable. Thankful for advice.   Routing to Dr Oscar La for review.  Encounter closed.   CC: Dr Edward Jolly for [facility].

## 2019-06-13 ENCOUNTER — Telehealth: Payer: Self-pay | Admitting: *Deleted

## 2019-06-13 ENCOUNTER — Other Ambulatory Visit: Payer: Self-pay | Admitting: *Deleted

## 2019-06-13 DIAGNOSIS — Z3490 Encounter for supervision of normal pregnancy, unspecified, unspecified trimester: Secondary | ICD-10-CM

## 2019-06-13 NOTE — Telephone Encounter (Signed)
Call to patient regarding pelvic ultrasound appointment with Dr Edward Jolly. Left message to call back.

## 2019-06-13 NOTE — Telephone Encounter (Signed)
Call from patient. Advised Dr Edward Jolly will be out of office for extended time due to family emergency. Rescheduled ultrasound with Dr Hyacinth Meeker.   Routing to Dr Hyacinth Meeker- [place]. Encounter closed.

## 2019-06-22 ENCOUNTER — Other Ambulatory Visit: Payer: Self-pay

## 2019-06-22 ENCOUNTER — Other Ambulatory Visit: Payer: Self-pay | Admitting: Obstetrics and Gynecology

## 2019-06-22 ENCOUNTER — Ambulatory Visit (INDEPENDENT_AMBULATORY_CARE_PROVIDER_SITE_OTHER): Payer: PRIVATE HEALTH INSURANCE

## 2019-06-22 ENCOUNTER — Encounter: Payer: Self-pay | Admitting: Obstetrics and Gynecology

## 2019-06-22 ENCOUNTER — Ambulatory Visit (INDEPENDENT_AMBULATORY_CARE_PROVIDER_SITE_OTHER): Payer: PRIVATE HEALTH INSURANCE | Admitting: Obstetrics and Gynecology

## 2019-06-22 VITALS — BP 118/76 | HR 82 | Temp 98.2°F | Wt 142.6 lb

## 2019-06-22 DIAGNOSIS — N926 Irregular menstruation, unspecified: Secondary | ICD-10-CM

## 2019-06-22 DIAGNOSIS — Z3201 Encounter for pregnancy test, result positive: Secondary | ICD-10-CM | POA: Diagnosis not present

## 2019-06-22 DIAGNOSIS — O219 Vomiting of pregnancy, unspecified: Secondary | ICD-10-CM | POA: Diagnosis not present

## 2019-06-22 DIAGNOSIS — Z3490 Encounter for supervision of normal pregnancy, unspecified, unspecified trimester: Secondary | ICD-10-CM

## 2019-06-22 NOTE — Progress Notes (Signed)
GYNECOLOGY  VISIT   HPI: 24 y.o.   Married White or Caucasian Not Hispanic or Latino  female   G1P0000 with Patient's last menstrual period was 04/29/2019 (exact date).   here for consult following ultrasound. Patient is experiencing nausea that she has been taking Vitamin B6 and Unisom for. Patient is taking 100 mg daily of Vitamin B6 would like to know if she can increase the dose of this medication.    Not vomiting or losing weight.   Taking PNV at hs.   GYNECOLOGIC HISTORY: Patient's last menstrual period was 04/29/2019 (exact date). Contraception:None Menopausal hormone therapy: 04/29/2019        OB History    Gravida  1   Para  0   Term  0   Preterm  0   AB  0   Living  0     SAB  0   TAB  0   Ectopic  0   Multiple  0   Live Births                 Patient Active Problem List   Diagnosis Date Noted  . Routine general medical examination at a health care facility 11/15/2018  . Allergic rhinitis 02/24/2017  . Generalized anxiety disorder 11/23/2014  . ADHD (attention deficit hyperactivity disorder) 11/23/2014    Past Medical History:  Diagnosis Date  . ADHD (attention deficit hyperactivity disorder)   . Anxiety   . Dysmenorrhea   . Elevated TSH   . HSV-1 infection   . UTI (lower urinary tract infection)     Past Surgical History:  Procedure Laterality Date  . WISDOM TOOTH EXTRACTION      Current Outpatient Medications  Medication Sig Dispense Refill  . escitalopram (LEXAPRO) 10 MG tablet Take 1 tablet (10 mg total) by mouth daily. 90 tablet 3  . levothyroxine (SYNTHROID) 50 MCG tablet Take 1 tablet (50 mcg total) by mouth daily. Take at least 30 minutes prior to eating breakfast. 90 tablet 3  . Prenatal Vit-Fe Fumarate-FA (PRENATAL VITAMINS PO) Take 1 tablet by mouth daily.    . valACYclovir (VALTREX) 500 MG tablet Take one tablet (500 mg) by mouth daily for infection prevention.  Take one tablet (500 mg) by mouth twice a day for 3 days as  needed. 100 tablet 3   No current facility-administered medications for this visit.     ALLERGIES: Patient has no known allergies.  Family History  Problem Relation Age of Onset  . Thyroid disease Mother   . Heart attack Maternal Grandfather   . Rashes / Skin problems Maternal Grandfather   . Cancer Paternal Grandmother        kidney  . Hypertension Father     Social History   Socioeconomic History  . Marital status: Married    Spouse name: Not on file  . Number of children: 0  . Years of education: 28  . Highest education level: Not on file  Occupational History  . Occupation: Consulting civil engineer    Comment: Patent examiner  Tobacco Use  . Smoking status: Never Smoker  . Smokeless tobacco: Never Used  Substance and Sexual Activity  . Alcohol use: Yes    Comment: occ.  . Drug use: No  . Sexual activity: Yes    Partners: Male  Other Topics Concern  . Not on file  Social History Narrative   Fun: Exercise, golf, be at home.    Denies abuse and feels safe at  home.    Social Determinants of Health   Financial Resource Strain:   . Difficulty of Paying Living Expenses:   Food Insecurity:   . Worried About Charity fundraiser in the Last Year:   . Arboriculturist in the Last Year:   Transportation Needs:   . Film/video editor (Medical):   Marland Kitchen Lack of Transportation (Non-Medical):   Physical Activity:   . Days of Exercise per Week:   . Minutes of Exercise per Session:   Stress:   . Feeling of Stress :   Social Connections:   . Frequency of Communication with Friends and Family:   . Frequency of Social Gatherings with Friends and Family:   . Attends Religious Services:   . Active Member of Clubs or Organizations:   . Attends Archivist Meetings:   Marland Kitchen Marital Status:   Intimate Partner Violence:   . Fear of Current or Ex-Partner:   . Emotionally Abused:   Marland Kitchen Physically Abused:   . Sexually Abused:     Review of Systems  Constitutional: Positive for  malaise/fatigue.  HENT: Negative.   Eyes: Negative.   Respiratory: Negative.   Cardiovascular: Negative.   Gastrointestinal: Positive for nausea.  Genitourinary: Negative.   Musculoskeletal: Negative.   Skin: Negative.   Neurological: Negative.   Endo/Heme/Allergies: Negative.   Psychiatric/Behavioral: Negative.     PHYSICAL EXAMINATION:    BP 118/76 (BP Location: Right Arm, Patient Position: Sitting, Cuff Size: Normal)   Pulse 82   Temp 98.2 F (36.8 C) (Skin)   Wt 142 lb 9.6 oz (64.7 kg)   LMP 04/29/2019 (Exact Date)   BMI 24.10 kg/m     General appearance: alert, cooperative and appears stated age   OB transvaginal ultrasound Viable IUP.  Size equal to dates.  [facility] 02/03/20.  FH 151 BPM.  Yolk sac seen. Cervix closed.   ASSESSMENT  Early stage of pregnancy.  Nausea of pregnancy.   PLAN  Ultrasound findings and images reviewed with patient.  We discussed nausea and vomiting of pregnancy.   She can increase her total vit B6 to 200 mg total daily and her Unisom to 50 mg total daily.  I recommended dosing every 6 - 8 hours for each.  She can also try ginger. She will establish care with Dr. Landry Mellow at [place], and she has an appointment.    An After Visit Summary was printed and given to the patient.  15 minutes face to face time of which over 50% was spent in counseling.

## 2019-06-30 ENCOUNTER — Telehealth: Payer: Self-pay

## 2019-06-30 NOTE — Telephone Encounter (Signed)
Left message for pt to return call to triage RN. 

## 2019-06-30 NOTE — Telephone Encounter (Signed)
Patient is calling in regards to appointment on (07/05/19). Patient would like to know if she needs to keep appointment or if it is okay to cancel.

## 2019-07-03 NOTE — Telephone Encounter (Signed)
Spoke with pt. Pt wanting to know if needs 6 month follow for thyroid labs that was scheduled for 07/05/19 with Dr Edward Jolly. Pt had increased  Synthroid in 03/2019 to 50 mcg.   Pt states will see Dr Richardson Dopp at [facility] for new pt exam with prenatal labs that will include TSH and Free T4 tomorrow 07/04/19.   Advised pt will review with Dr Edward Jolly if follow up needed and return call to pt. Pt agreeable.   Routing to Dr Edward Jolly.

## 2019-07-03 NOTE — Telephone Encounter (Signed)
Cancelled 07/05/19 follow up thyroid appt with Dr Edward Jolly. Pt agreeable and verbalized understanding.  Routing to Dr Edward Jolly for review.  Encounter closed.

## 2019-07-03 NOTE — Telephone Encounter (Signed)
Ok to cancel appointment her for thyroid recheck.  Her OB team will take over this care for her.  Thanks for checking in with me.

## 2019-07-04 LAB — OB RESULTS CONSOLE ABO/RH: RH Type: POSITIVE

## 2019-07-04 LAB — OB RESULTS CONSOLE RUBELLA ANTIBODY, IGM: Rubella: IMMUNE

## 2019-07-04 LAB — OB RESULTS CONSOLE HIV ANTIBODY (ROUTINE TESTING): HIV: NONREACTIVE

## 2019-07-04 LAB — OB RESULTS CONSOLE HEPATITIS B SURFACE ANTIGEN: Hepatitis B Surface Ag: NEGATIVE

## 2019-07-05 ENCOUNTER — Ambulatory Visit: Payer: Self-pay | Admitting: Obstetrics and Gynecology

## 2019-07-13 LAB — OB RESULTS CONSOLE GC/CHLAMYDIA
Chlamydia: NEGATIVE
Gonorrhea: NEGATIVE

## 2019-10-16 LAB — OB RESULTS CONSOLE RPR: RPR: NONREACTIVE

## 2019-10-30 ENCOUNTER — Ambulatory Visit: Payer: BC Managed Care – PPO | Admitting: Obstetrics and Gynecology

## 2019-11-07 ENCOUNTER — Telehealth: Payer: Self-pay | Admitting: Obstetrics and Gynecology

## 2019-11-07 NOTE — Telephone Encounter (Signed)
Spoke with pt. Pt states needing refill on valtrex Rx. Pt states is out and taking daily for suppression. Pt is currently pregnant and is under the care of Dr Richardson Dopp at [facility]. Pt advised to call OB office for refill while pregnant. Pt agreeable and verbalized understanding.  Encounter closed.

## 2019-11-07 NOTE — Telephone Encounter (Signed)
Patient is asking for a refill of Valacyclovir. [facility], [address], [place], [place]

## 2019-11-22 ENCOUNTER — Other Ambulatory Visit: Payer: Self-pay | Admitting: Obstetrics and Gynecology

## 2019-11-22 DIAGNOSIS — R2232 Localized swelling, mass and lump, left upper limb: Secondary | ICD-10-CM

## 2019-11-24 ENCOUNTER — Ambulatory Visit
Admission: RE | Admit: 2019-11-24 | Discharge: 2019-11-24 | Disposition: A | Discharge status: Home / Self Care | Payer: PRIVATE HEALTH INSURANCE | Source: Ambulatory Visit | Attending: Obstetrics and Gynecology | Admitting: Obstetrics and Gynecology

## 2019-11-24 ENCOUNTER — Other Ambulatory Visit: Payer: Self-pay

## 2019-11-24 DIAGNOSIS — R2232 Localized swelling, mass and lump, left upper limb: Secondary | ICD-10-CM

## 2020-01-23 ENCOUNTER — Telehealth (HOSPITAL_COMMUNITY): Payer: Self-pay | Admitting: *Deleted

## 2020-01-23 ENCOUNTER — Encounter (HOSPITAL_COMMUNITY): Payer: Self-pay | Admitting: *Deleted

## 2020-01-23 NOTE — Telephone Encounter (Signed)
Preadmission screen  

## 2020-01-24 ENCOUNTER — Telehealth (HOSPITAL_COMMUNITY): Payer: Self-pay | Admitting: *Deleted

## 2020-01-24 NOTE — Telephone Encounter (Signed)
Preadmission screen  

## 2020-01-26 ENCOUNTER — Telehealth (HOSPITAL_COMMUNITY): Payer: Self-pay | Admitting: *Deleted

## 2020-01-26 ENCOUNTER — Encounter (HOSPITAL_COMMUNITY): Payer: Self-pay | Admitting: *Deleted

## 2020-01-26 NOTE — Telephone Encounter (Signed)
Preadmission screen  

## 2020-01-27 ENCOUNTER — Inpatient Hospital Stay (HOSPITAL_COMMUNITY)
Admission: AD | Admit: 2020-01-27 | Discharge: 2020-01-29 | DRG: 806 | Disposition: A | Discharge status: Home / Self Care | Payer: No Typology Code available for payment source | Attending: Obstetrics and Gynecology | Admitting: Obstetrics and Gynecology

## 2020-01-27 ENCOUNTER — Inpatient Hospital Stay (HOSPITAL_COMMUNITY): Payer: No Typology Code available for payment source | Admitting: Anesthesiology

## 2020-01-27 ENCOUNTER — Encounter (HOSPITAL_COMMUNITY): Payer: Self-pay | Admitting: Obstetrics and Gynecology

## 2020-01-27 ENCOUNTER — Other Ambulatory Visit: Payer: Self-pay

## 2020-01-27 DIAGNOSIS — Z20822 Contact with and (suspected) exposure to covid-19: Secondary | ICD-10-CM | POA: Diagnosis present

## 2020-01-27 DIAGNOSIS — E039 Hypothyroidism, unspecified: Secondary | ICD-10-CM | POA: Diagnosis present

## 2020-01-27 DIAGNOSIS — O99344 Other mental disorders complicating childbirth: Secondary | ICD-10-CM | POA: Diagnosis present

## 2020-01-27 DIAGNOSIS — O9832 Other infections with a predominantly sexual mode of transmission complicating childbirth: Secondary | ICD-10-CM | POA: Diagnosis present

## 2020-01-27 DIAGNOSIS — O99284 Endocrine, nutritional and metabolic diseases complicating childbirth: Principal | ICD-10-CM | POA: Diagnosis present

## 2020-01-27 DIAGNOSIS — A6000 Herpesviral infection of urogenital system, unspecified: Secondary | ICD-10-CM | POA: Diagnosis present

## 2020-01-27 DIAGNOSIS — O26893 Other specified pregnancy related conditions, third trimester: Secondary | ICD-10-CM | POA: Diagnosis present

## 2020-01-27 DIAGNOSIS — O99892 Other specified diseases and conditions complicating childbirth: Secondary | ICD-10-CM | POA: Diagnosis present

## 2020-01-27 DIAGNOSIS — Z3A39 39 weeks gestation of pregnancy: Secondary | ICD-10-CM

## 2020-01-27 DIAGNOSIS — F419 Anxiety disorder, unspecified: Secondary | ICD-10-CM | POA: Diagnosis present

## 2020-01-27 DIAGNOSIS — N6332 Unspecified lump in axillary tail of the left breast: Secondary | ICD-10-CM | POA: Diagnosis present

## 2020-01-27 DIAGNOSIS — F32A Depression, unspecified: Secondary | ICD-10-CM | POA: Diagnosis present

## 2020-01-27 LAB — TYPE AND SCREEN
ABO/RH(D): B POS
Antibody Screen: NEGATIVE

## 2020-01-27 LAB — CBC
HCT: 38.1 % (ref 36.0–46.0)
Hemoglobin: 12.6 g/dL (ref 12.0–15.0)
MCH: 31.1 pg (ref 26.0–34.0)
MCHC: 33.1 g/dL (ref 30.0–36.0)
MCV: 94.1 fL (ref 80.0–100.0)
Platelets: 142 10*3/uL — ABNORMAL LOW (ref 150–400)
RBC: 4.05 MIL/uL (ref 3.87–5.11)
RDW: 14 % (ref 11.5–15.5)
WBC: 8.5 10*3/uL (ref 4.0–10.5)
nRBC: 0 % (ref 0.0–0.2)

## 2020-01-27 LAB — RESP PANEL BY RT-PCR (FLU A&B, COVID) ARPGX2
Influenza A by PCR: NEGATIVE
Influenza B by PCR: NEGATIVE
SARS Coronavirus 2 by RT PCR: NEGATIVE

## 2020-01-27 MED ORDER — OXYTOCIN BOLUS FROM INFUSION
333.0000 mL | Freq: Once | INTRAVENOUS | Status: AC
  Administered 2020-01-27: 333 mL via INTRAVENOUS

## 2020-01-27 MED ORDER — LACTATED RINGERS IV SOLN
500.0000 mL | Freq: Once | INTRAVENOUS | Status: AC
  Administered 2020-01-27: 500 mL via INTRAVENOUS

## 2020-01-27 MED ORDER — SOD CITRATE-CITRIC ACID 500-334 MG/5ML PO SOLN: 30.0000 mL | ORAL | Status: DC | PRN

## 2020-01-27 MED ORDER — LIDOCAINE HCL (PF) 1 % IJ SOLN: 30.0000 mL | INTRAMUSCULAR | Status: DC | PRN

## 2020-01-27 MED ORDER — OXYCODONE-ACETAMINOPHEN 5-325 MG PO TABS: 2.0000 | ORAL_TABLET | ORAL | Status: DC | PRN

## 2020-01-27 MED ORDER — EPHEDRINE 5 MG/ML INJ: 10.0000 mg | INTRAVENOUS | Status: DC | PRN

## 2020-01-27 MED ORDER — TERBUTALINE SULFATE 1 MG/ML IJ SOLN: 0.2500 mg | Freq: Once | INTRAMUSCULAR | Status: DC | PRN

## 2020-01-27 MED ORDER — FLEET ENEMA 7-19 GM/118ML RE ENEM: 1.0000 | ENEMA | RECTAL | Status: DC | PRN

## 2020-01-27 MED ORDER — PHENYLEPHRINE 40 MCG/ML (10ML) SYRINGE FOR IV PUSH (FOR BLOOD PRESSURE SUPPORT): 80.0000 ug | PREFILLED_SYRINGE | INTRAVENOUS | Status: DC | PRN

## 2020-01-27 MED ORDER — LACTATED RINGERS IV SOLN: 500.0000 mL | INTRAVENOUS | Status: DC | PRN

## 2020-01-27 MED ORDER — FENTANYL-BUPIVACAINE-NACL 0.5-0.125-0.9 MG/250ML-% EP SOLN
12.0000 mL/h | EPIDURAL | Status: DC | PRN
  Filled 2020-01-27: qty 250

## 2020-01-27 MED ORDER — OXYTOCIN-SODIUM CHLORIDE 30-0.9 UT/500ML-% IV SOLN
2.5000 [IU]/h | INTRAVENOUS | Status: DC
  Filled 2020-01-27: qty 500

## 2020-01-27 MED ORDER — LIDOCAINE HCL (PF) 1 % IJ SOLN
INTRAMUSCULAR | Status: DC | PRN
  Administered 2020-01-27: 5 mL via EPIDURAL

## 2020-01-27 MED ORDER — ONDANSETRON HCL 4 MG/2ML IJ SOLN: 4.0000 mg | Freq: Four times a day (QID) | INTRAMUSCULAR | Status: DC | PRN

## 2020-01-27 MED ORDER — DIPHENHYDRAMINE HCL 50 MG/ML IJ SOLN: 12.5000 mg | INTRAMUSCULAR | Status: DC | PRN

## 2020-01-27 MED ORDER — OXYCODONE-ACETAMINOPHEN 5-325 MG PO TABS: 1.0000 | ORAL_TABLET | ORAL | Status: DC | PRN

## 2020-01-27 MED ORDER — LACTATED RINGERS IV SOLN: INTRAVENOUS | Status: DC

## 2020-01-27 MED ORDER — ACETAMINOPHEN 325 MG PO TABS: 650.0000 mg | ORAL_TABLET | ORAL | Status: DC | PRN

## 2020-01-27 MED ORDER — OXYTOCIN-SODIUM CHLORIDE 30-0.9 UT/500ML-% IV SOLN
1.0000 m[IU]/min | INTRAVENOUS | Status: DC
  Administered 2020-01-27: 2 m[IU]/min via INTRAVENOUS

## 2020-01-27 MED ORDER — SODIUM CHLORIDE (PF) 0.9 % IJ SOLN
INTRAMUSCULAR | Status: DC | PRN
  Administered 2020-01-27: 12 mL/h via EPIDURAL

## 2020-01-27 NOTE — MAU Note (Signed)
Started contracting around 0730, no bleeding or leaking. Ctx's now every 5 min.  Denies problems with preg. Was 1+ cm last wk.

## 2020-01-27 NOTE — Progress Notes (Signed)
OB Progress Note  S: Patient comfortable with epidural.  Pitocin at 31mU/min.  O: BP (!) 120/99   Pulse 71   Temp 98.4 F (36.9 C) (Oral)   Resp 17   Ht 5\' 4"  (1.626 m)   Wt 76.4 kg   LMP 04/29/2019 (Exact Date)   SpO2 99%   BMI 28.92 kg/m   FHT: 120bpm, moderate variablity, + accels, - decels Toco: q1-2 minutes SVE: 9/100/-1  A/P: 24 y.o. G1P0000 @ [redacted]w[redacted]d admitted for labor.  FWB: Cat. I Labor course: S/p AROM and Pitocin - currently at 77mU/min.  Attempted to practice push past reducible cervix without descent - will continue to labor down and push once completely dilated and fetus and descended. Pain: Epidrual GBS: negative Anticipate SVD  4m, DO [id number] (office)

## 2020-01-27 NOTE — H&P (Signed)
HPI: 24 y.o. G1P0000 @ [redacted]w[redacted]d estimated gestational age (as dated by LMP c/w 19 week ultrasound) presents with contractions every 5 minutes since about 0730 today.  Leakage of fluid:  No Vaginal bleeding:  No Contractions:  Yes - q5 minutes Fetal movement:  Present  Prenatal care has been provided by Dr. Gerald Leitz [facility]).  ROS:  Denies fevers, chills, chest pain, visual changes, SOB, RUQ/epigastric pain, N/V, dysuria, hematuria, or sudden onset/worsening bilateral LE or facial edema.  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) Hypothyroidism Anxiety/depression Left axillary mass  Prenatal Transfer Tool  Maternal Diabetes: No Genetic Screening: Declined Maternal Ultrasounds/Referrals: Normal Fetal Ultrasounds or other Referrals:  None Maternal Substance Abuse:  No Significant Maternal Medications:  None Significant Maternal Lab Results: Group B Strep negative   PNL:  GBS neg, Rub Immune, Hep B neg, RPR NR, HIV neg, GC/C neg, glucola: 92 (wnl) H/H: 11.7/33.2 (10/16/2019) Blood type: B Positive  Immunizations: Tdap: Given prenatally Flu: Unknown  OBHx:  OB History    Gravida  1   Para  0   Term  0   Preterm  0   AB  0   Living  0     SAB  0   TAB  0   Ectopic  0   Multiple  0   Live Births             PMHx:  HSV1, Hypothyroidism, Anxiety/depression Meds:  PNV Allergy:  No Known Allergies SurgHx: None SocHx:   Denies Tobacco, ETOH, illicit drugs  O: BP 130/88   Pulse 90   Ht 5\' 4"  (1.626 m)   Wt 76.4 kg   LMP 04/29/2019 (Exact Date)   SpO2 99%   BMI 28.92 kg/m  Gen. AAOx3, NAD CV.  RRR  Resp. CTAB, no wheezes/rales/rhonchi Abd. Gravid, soft, non-tender throughout, no rebound/guarding Extr.  no bilateral LE edema, no calf tenderness bilaterally SVE: 5/70-80/-2, cephalic by sutures per RN  FHT: 130 baseline, modeate variability, + accels,  - decels Toco: q2-3 min   Labs: see orders  A/P:  24 y.o. G1P0000 @ [redacted]w[redacted]d who is admitted  for labor.    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

## 2020-01-27 NOTE — Anesthesia Procedure Notes (Signed)
Epidural Patient location during procedure: OB Start time: 01/27/2020 1:11 PM End time: 01/27/2020 1:24 PM  Staffing Anesthesiologist: Trevor Iha, MD Performed: anesthesiologist   Preanesthetic Checklist Completed: patient identified, IV checked, site marked, risks and benefits discussed, surgical consent, monitors and equipment checked, pre-op evaluation and timeout performed  Epidural Patient position: sitting Prep: DuraPrep and site prepped and draped Patient monitoring: continuous pulse ox and blood pressure Approach: midline Location: L3-L4 Injection technique: LOR air  Needle:  Needle type: Tuohy  Needle gauge: 17 G Needle length: 9 cm and 9 Needle insertion depth: 5 cm cm Catheter type: closed end flexible Catheter size: 19 Gauge Catheter at skin depth: 11 cm Test dose: negative  Assessment Events: blood not aspirated, injection not painful, no injection resistance, no paresthesia and negative IV test  Additional Notes Patient identified. Risks/Benefits/Options discussed with patient including but not limited to bleeding, infection, nerve damage, paralysis, failed block, incomplete pain control, headache, blood pressure changes, nausea, vomiting, reactions to medication both or allergic, itching and postpartum back pain. Confirmed with bedside nurse the patient's most recent platelet count. Confirmed with patient that they are not currently taking any anticoagulation, have any bleeding history or any family history of bleeding disorders. Patient expressed understanding and wished to proceed. All questions were answered. Sterile technique was used throughout the entire procedure. Please see nursing notes for vital signs. Test dose was given through epidural needle and negative prior to continuing to dose epidural or start infusion. Warning signs of high block given to the patient including shortness of breath, tingling/numbness in hands, complete motor block, or any  concerning symptoms with instructions to call for help. Patient was given instructions on fall risk and not to get out of bed. All questions and concerns addressed with instructions to call with any issues. 1  Attempt (S) . Patient tolerated procedure well.

## 2020-01-27 NOTE — Progress Notes (Signed)
OB Progress Note  S: Patient resting with epidural - has a hot spot on left lower abdomen.    O: BP 121/66   Pulse 90   Temp 98.4 F (36.9 C) (Oral)   Resp 17   Ht 5\' 4"  (1.626 m)   Wt 76.4 kg   LMP 04/29/2019 (Exact Date)   SpO2 99%   BMI 28.92 kg/m   FHT: 120bpm, moderate variablity, + accels, - decels Toco: not graphing SVE: 6/80/-2, sutures palpated, IUPC placed AROM:  Clear, non-odorous  A/P: 24 y.o. G1P0000 @ [redacted]w[redacted]d admitted for labor.  FWB: Cat. I Labor course: S/p AROM with clear non-odorous fluid, IUPC placed - pitocin ordered for further augmentation. Pain: Epidrual GBS: negative Anticipate SVD  [redacted]w[redacted]d, DO [phone] (office)

## 2020-01-27 NOTE — Anesthesia Preprocedure Evaluation (Signed)
Anesthesia Evaluation  Patient identified by MRN, date of birth, ID band Patient awake    Reviewed: Allergy & Precautions, NPO status , Patient's Chart, lab work & pertinent test results  Airway Mallampati: II  TM Distance: >3 FB Neck ROM: Full    Dental no notable dental hx. (+) Teeth Intact, Dental Advisory Given   Pulmonary neg pulmonary ROS,    Pulmonary exam normal breath sounds clear to auscultation       Cardiovascular Exercise Tolerance: Good negative cardio ROS Normal cardiovascular exam Rhythm:Regular Rate:Normal     Neuro/Psych PSYCHIATRIC DISORDERS Anxiety Depression negative neurological ROS     GI/Hepatic negative GI ROS, Neg liver ROS,   Endo/Other  Hypothyroidism   Renal/GU negative Renal ROS     Musculoskeletal   Abdominal   Peds  Hematology hgb 12.6 Plt 142   Anesthesia Other Findings   Reproductive/Obstetrics (+) Pregnancy                             Anesthesia Physical Anesthesia Plan  ASA: II  Anesthesia Plan: Epidural   Post-op Pain Management:    Induction:   PONV Risk Score and Plan:   Airway Management Planned:   Additional Equipment:   Intra-op Plan:   Post-operative Plan:   Informed Consent: I have reviewed the patients History and Physical, chart, labs and discussed the procedure including the risks, benefits and alternatives for the proposed anesthesia with the patient or authorized representative who has indicated his/her understanding and acceptance.       Plan Discussed with:   Anesthesia Plan Comments: (39.2 Wk G1P0 for LEA)        Anesthesia Quick Evaluation

## 2020-01-28 ENCOUNTER — Encounter (HOSPITAL_COMMUNITY): Payer: Self-pay | Admitting: Obstetrics and Gynecology

## 2020-01-28 LAB — CBC
HCT: 29.1 % — ABNORMAL LOW (ref 36.0–46.0)
Hemoglobin: 10 g/dL — ABNORMAL LOW (ref 12.0–15.0)
MCH: 31.3 pg (ref 26.0–34.0)
MCHC: 34.4 g/dL (ref 30.0–36.0)
MCV: 91.2 fL (ref 80.0–100.0)
Platelets: 118 10*3/uL — ABNORMAL LOW (ref 150–400)
RBC: 3.19 MIL/uL — ABNORMAL LOW (ref 3.87–5.11)
RDW: 13.9 % (ref 11.5–15.5)
WBC: 13.9 10*3/uL — ABNORMAL HIGH (ref 4.0–10.5)
nRBC: 0 % (ref 0.0–0.2)

## 2020-01-28 LAB — COMPREHENSIVE METABOLIC PANEL
ALT: 31 U/L (ref 0–44)
AST: 45 U/L — ABNORMAL HIGH (ref 15–41)
Albumin: 2.5 g/dL — ABNORMAL LOW (ref 3.5–5.0)
Alkaline Phosphatase: 133 U/L — ABNORMAL HIGH (ref 38–126)
Anion gap: 12 (ref 5–15)
BUN: 13 mg/dL (ref 6–20)
CO2: 20 mmol/L — ABNORMAL LOW (ref 22–32)
Calcium: 8.5 mg/dL — ABNORMAL LOW (ref 8.9–10.3)
Chloride: 104 mmol/L (ref 98–111)
Creatinine, Ser: 0.87 mg/dL (ref 0.44–1.00)
GFR, Estimated: >60 mL/min (ref >60)
Glucose, Bld: 81 mg/dL (ref 70–99)
Potassium: 3.9 mmol/L (ref 3.5–5.1)
Sodium: 136 mmol/L (ref 135–145)
Total Bilirubin: 0.9 mg/dL (ref 0.3–1.2)
Total Protein: 5.2 g/dL — ABNORMAL LOW (ref 6.5–8.1)

## 2020-01-28 LAB — RPR: RPR Ser Ql: NONREACTIVE

## 2020-01-28 LAB — LACTATE DEHYDROGENASE: LDH: 193 U/L — ABNORMAL HIGH (ref 98–192)

## 2020-01-28 MED ORDER — PRENATAL MULTIVITAMIN CH
1.0000 | ORAL_TABLET | Freq: Every day | ORAL | Status: DC
  Administered 2020-01-28 – 2020-01-29 (×2): 1 via ORAL
  Filled 2020-01-28 (×2): qty 1

## 2020-01-28 MED ORDER — WITCH HAZEL-GLYCERIN EX PADS
1.0000 "application " | MEDICATED_PAD | CUTANEOUS | Status: DC | PRN
  Administered 2020-01-28: 1 via TOPICAL

## 2020-01-28 MED ORDER — ONDANSETRON HCL 4 MG PO TABS: 4.0000 mg | ORAL_TABLET | ORAL | Status: DC | PRN

## 2020-01-28 MED ORDER — ZOLPIDEM TARTRATE 5 MG PO TABS: 5.0000 mg | ORAL_TABLET | Freq: Every evening | ORAL | Status: DC | PRN

## 2020-01-28 MED ORDER — SIMETHICONE 80 MG PO CHEW: 80.0000 mg | CHEWABLE_TABLET | ORAL | Status: DC | PRN

## 2020-01-28 MED ORDER — LEVOTHYROXINE SODIUM 50 MCG PO TABS
50.0000 ug | ORAL_TABLET | Freq: Every day | ORAL | Status: DC
  Administered 2020-01-28 – 2020-01-29 (×2): 50 ug via ORAL
  Filled 2020-01-28 (×2): qty 1

## 2020-01-28 MED ORDER — COCONUT OIL OIL
1.0000 "application " | TOPICAL_OIL | Status: DC | PRN
  Administered 2020-01-28: 1 via TOPICAL

## 2020-01-28 MED ORDER — SENNOSIDES-DOCUSATE SODIUM 8.6-50 MG PO TABS
2.0000 | ORAL_TABLET | ORAL | Status: DC
  Administered 2020-01-28 (×2): 2 via ORAL
  Filled 2020-01-28 (×2): qty 2

## 2020-01-28 MED ORDER — ONDANSETRON HCL 4 MG/2ML IJ SOLN: 4.0000 mg | INTRAMUSCULAR | Status: DC | PRN

## 2020-01-28 MED ORDER — BENZOCAINE-MENTHOL 20-0.5 % EX AERO
1.0000 "application " | INHALATION_SPRAY | CUTANEOUS | Status: DC | PRN
  Administered 2020-01-28: 1 via TOPICAL
  Filled 2020-01-28: qty 56

## 2020-01-28 MED ORDER — IBUPROFEN 600 MG PO TABS
600.0000 mg | ORAL_TABLET | Freq: Four times a day (QID) | ORAL | Status: DC
  Administered 2020-01-28 – 2020-01-29 (×6): 600 mg via ORAL
  Filled 2020-01-28 (×6): qty 1

## 2020-01-28 MED ORDER — ACETAMINOPHEN 325 MG PO TABS: 650.0000 mg | ORAL_TABLET | ORAL | Status: DC | PRN

## 2020-01-28 MED ORDER — DIPHENHYDRAMINE HCL 25 MG PO CAPS: 25.0000 mg | ORAL_CAPSULE | Freq: Four times a day (QID) | ORAL | Status: DC | PRN

## 2020-01-28 MED ORDER — DIBUCAINE (PERIANAL) 1 % EX OINT: 1.0000 "application " | TOPICAL_OINTMENT | CUTANEOUS | Status: DC | PRN

## 2020-01-28 MED ORDER — TETANUS-DIPHTH-ACELL PERTUSSIS 5-2.5-18.5 LF-MCG/0.5 IM SUSY: 0.5000 mL | PREFILLED_SYRINGE | Freq: Once | INTRAMUSCULAR | Status: DC

## 2020-01-28 NOTE — Lactation Note (Signed)
This note was copied from a baby's chart. Lactation Consultation Note  Patient Name: Dana Welch Today's Date: 01/28/2020 Reason for consult: Follow-up assessment;Term;Primapara;1st time breastfeeding   P1 mother whose infant is now 44 hours old.  This is a term baby at 39+0 weeks.  Baby was swaddled and asleep when I arrived.  Mother was eating lunch.  Mother had no immediate questions/concerns related to breast feeding.  She feels like baby has latched and fed well since delivery.  He has a strong suck but mother denies pain with feeding.  Her nipples are sensitive and I suggested EBM and coconut oil for comfort.  Nipples are intact.  Reviewed breast feeding basics and encouraged mother to call her RN/LC for latch assistance/observation as needed.  Mother will feed 8-12 times/24 hours or sooner if baby shows cues.  Hand expression recommended before/after feedings to help with milk supply.  Mother will finger feed/spoon feed any EBM she obtains to baby.  Mother has a DEBP for home use.  Grandmother present.   Maternal Data Formula Feeding for Exclusion: No Has patient been taught Hand Expression?: Yes Does the patient have breastfeeding experience prior to this delivery?: No  Feeding    LATCH Score                   Interventions    Lactation Tools Discussed/Used     Consult Status Consult Status: Follow-up Date: 01/29/20 Follow-up type: In-patient    Gabriele Zwilling R Avi Kerschner 01/28/2020, 1:10 PM

## 2020-01-28 NOTE — Anesthesia Postprocedure Evaluation (Signed)
Anesthesia Post Note  Patient: Dana Welch  Procedure(s) Performed: AN AD HOC LABOR EPIDURAL     Patient location during evaluation: Mother Baby Anesthesia Type: Epidural Level of consciousness: awake and alert and oriented Pain management: satisfactory to patient Vital Signs Assessment: post-procedure vital signs reviewed and stable Respiratory status: respiratory function stable Cardiovascular status: stable Postop Assessment: no headache, no backache, epidural receding, patient able to bend at knees, no signs of nausea or vomiting, adequate PO intake and able to ambulate Anesthetic complications: no   No complications documented.  Last Vitals:  Vitals:   01/28/20 0220 01/28/20 0625  BP: 117/71 100/90  Pulse: 90 80  Resp: 17 19  Temp: 37.3 C 37.1 C  SpO2: 98% 100%    Last Pain:  Vitals:   01/28/20 0625  TempSrc: Oral  PainSc: 0-No pain   Pain Goal:                   [name]

## 2020-01-28 NOTE — Progress Notes (Addendum)
Postpartum Note Day # 1  S:  Patient doing well.  Pain controlled.  Tolerating regular diet.   Ambulating and voiding without difficulty.   Denies fevers, chills, chest pain, SOB, N/V, or worsening bilateral LE edema.  Lochia: Minimal Infant feeding:  Breast Circumcision:  Desires Contraception:  Not discussed today  O: Temp:  [97.9 F (36.6 C)-99.7 F (37.6 C)] 98.5 F (36.9 C) (11/28 1031) Pulse Rate:  [69-167] 78 (11/28 1031) Resp:  [16-20] 18 (11/28 1031) BP: (100-225)/(64-207) 118/68 (11/28 1031) SpO2:  [72 %-100 %] 100 % (11/28 0625) Gen: NAD, pleasant and cooperative CV: RRR Resp: CTAB, no wheezes/rales/rhonchi Abdomen: soft, non-distended, non-tender throughout Uterus: firm, non-tender, below umbilicus Ext: no bilateral LE edema, no bilateral calf tenderness  Labs: Recent Labs    01/27/20 1120 01/28/20 0652  HGB 12.6 10.0*    A/P: Patient is a 24 y.o. G1P1001 PPD#1 s/p SVD.  S/p SVD - Pain well controlled  - GU: UOP is adequate - GI: Tolerating regular diet - Activity: encouraged sitting up to chair and ambulation as tolerated - DVT Prophylaxis: SCDs - Labs: stable as above  Elevated Bps - Bps normal to mild range - HELLP labs and P/C ratio ordered  Hypothyroidism - Medication:  Synthroid qD  Anxiety/depression - Medication:  Zoloft 50mg  qD - Needs 2 week EPDS postpartum  Disposition:  PPD#2   , DO [phone] (office)

## 2020-01-28 NOTE — Progress Notes (Signed)
CSW received consult for hx of Anxiety and Depression.  CSW met with Dana Welch at bedside to offer support and complete assessment.  On arrival, CSW introduced self and stated visit purpose. Dana Welch and infant Dana Welch were present, however, after PPD/A and SIDS education, Dana Welch stepped out of room to offer Dana Welch privacy during assessment. Dana Welch and Dana Welch were pleasant and engaged during visit.   CSW provided education regarding the baby blues period vs. perinatal mood disorders, discussed treatment and gave resources for mental health follow up if concerns arise.  CSW recommends self-evaluation during the postpartum time period using the New Mom Checklist from Postpartum Progress and encouraged Dana Welch and Dana Welch to contact a medical professional if symptoms are noted at any time. Dana Welch and Dana Welch stated understanding and denied any questions.   CSW provided review of Sudden Infant Death Syndrome (SIDS) precautions. Dana Welch and Dana Welch stated understanding and denied any questions. Dana Welch confirmed having all needed items for baby including car seat and both bassinet and crib for baby's safe sleep.   During assessment, Dana Welch confirmed h/o depression and anxiety. Dana Welch reported she has been stable on meds since 2019. Dana Welch has active Rx for Zoloft of which she reports taking daily. Dana Welch denied any SI, HI, or domestic violence. Dana Welch reported coping skills such as talking through feelings with FOB and/or family. Dana Welch reported current mood as "good" "I'm on a sweet baby high". Dana Welch declined any referrals or additional resources at this time and stated she will connect with a therapist and/or talk with OB if things change.     CSW identifies no further need for intervention and no barriers to discharge at this time.  Dana Welch D. Lissa Morales, MSW, [place] Clinical Social Worker [phone]

## 2020-01-28 NOTE — Lactation Note (Signed)
This note was copied from a baby's chart. Lactation Consultation Note  Patient Name: Dana Welch Today's Date: 01/28/2020 Reason for consult: Initial assessment;1st time breastfeeding;Term P1, 3 hour term female infant. Per mom, infant BF for 20 minutes in L&D. Infant had one void and one stool that RN changed while LC was in the room. Per mom, she has DEBP at home and she watch you tube videos of breastfeeding in her pregnancy. Mom was attempting to latch infant in football hold without pillow support bent over. LC brought pillows to support mom and infant. LC discussed hand expression and mom taught back easily expressing 4 mls of colostrum that was spoon feed to infant. Infant was cuing to BF, mom re-latch infant on her left breast using the football hold position, infant latched with depth, swallows observed and was still BF after 10 minutes when LC left the room. Per mom, she only feel a tug no pain with latch. Mom knows to BF according to cues, 8 to 12+ times within 24 hours, STS. LC discussed infant's input and output with parents. Mom knows to ask RN or LC if she needs further assistance with latching infant at the breast.  LC suggested mom attend the [facility] Breastfeeding Support Group (free) within the local community. Mom made aware of O/P services, breastfeeding support groups, community resources, and our phone # for post-discharge questions.   Maternal Data Formula Feeding for Exclusion: No Has patient been taught Hand Expression?: Yes Does the patient have breastfeeding experience prior to this delivery?: No  Feeding Feeding Type: Breast Fed  LATCH Score    Audible Swallowing: Spontaneous and intermittent  Type of Nipple: Everted at rest and after stimulation  Comfort (Breast/Nipple): Soft / non-tender  Hold (Positioning): Assistance needed to correctly position infant at breast and maintain latch.     Interventions Interventions: Breast feeding basics  reviewed;Assisted with latch;Breast compression;Skin to skin;Adjust position;Breast massage;Support pillows;Hand express;Position options;Expressed milk  Lactation Tools Discussed/Used WIC Program: No   Consult Status Consult Status: Follow-up Date: 01/28/20 Follow-up type: In-patient    Danelle Earthly 01/28/2020, 2:00 AM

## 2020-01-29 LAB — PROTEIN / CREATININE RATIO, URINE
Creatinine, Urine: 66.98 mg/dL
Protein Creatinine Ratio: 0.15 mg/mg{Cre} (ref 0.00–0.15)
Total Protein, Urine: 10 mg/dL

## 2020-01-29 MED ORDER — SERTRALINE HCL 50 MG PO TABS: 50.0000 mg | ORAL_TABLET | Freq: Every day | ORAL | 5 refills | Status: DC

## 2020-01-29 MED ORDER — SERTRALINE HCL 50 MG PO TABS: 50.0000 mg | ORAL_TABLET | Freq: Every day | ORAL | Status: DC

## 2020-01-29 MED ORDER — IBUPROFEN 800 MG PO TABS: 800.0000 mg | ORAL_TABLET | Freq: Three times a day (TID) | ORAL | 0 refills | Status: DC | PRN

## 2020-01-29 NOTE — Discharge Instructions (Signed)
Postpartum Care After Vaginal Delivery This sheet gives you information about how to care for yourself from the time you deliver your baby to up to 6-12 weeks after delivery (postpartum period). Your health care provider may also give you more specific instructions. If you have problems or questions, contact your health care provider. Follow these instructions at home: Vaginal bleeding  It is normal to have vaginal bleeding (lochia) after delivery. Wear a sanitary pad for vaginal bleeding and discharge. ? During the first week after delivery, the amount and appearance of lochia is often similar to a menstrual period. ? Over the next few weeks, it will gradually decrease to a dry, yellow-brown discharge. ? For most women, lochia stops completely by 4-6 weeks after delivery. Vaginal bleeding can vary from woman to woman.  Change your sanitary pads frequently. Watch for any changes in your flow, such as: ? A sudden increase in volume. ? A change in color. ? Large blood clots.  If you pass a blood clot from your vagina, save it and call your health care provider to discuss. Do not flush blood clots down the toilet before talking with your health care provider.  Do not use tampons or douches until your health care provider says this is safe.  If you are not breastfeeding, your period should return 6-8 weeks after delivery. If you are feeding your child breast milk only (exclusive breastfeeding), your period may not return until you stop breastfeeding. Perineal care  Keep the area between the vagina and the anus (perineum) clean and dry as told by your health care provider. Use medicated pads and pain-relieving sprays and creams as directed.  If you had a cut in the perineum (episiotomy) or a tear in the vagina, check the area for signs of infection until you are healed. Check for: ? More redness, swelling, or pain. ? Fluid or blood coming from the cut or tear. ? Warmth. ? Pus or a bad  smell.  You may be given a squirt bottle to use instead of wiping to clean the perineum area after you go to the bathroom. As you start healing, you may use the squirt bottle before wiping yourself. Make sure to wipe gently.  To relieve pain caused by an episiotomy, a tear in the vagina, or swollen veins in the anus (hemorrhoids), try taking a warm sitz bath 2-3 times a day. A sitz bath is a warm water bath that is taken while you are sitting down. The water should only come up to your hips and should cover your buttocks. Breast care  Within the first few days after delivery, your breasts may feel heavy, full, and uncomfortable (breast engorgement). Milk may also leak from your breasts. Your health care provider can suggest ways to help relieve the discomfort. Breast engorgement should go away within a few days.  If you are breastfeeding: ? Wear a bra that supports your breasts and fits you well. ? Keep your nipples clean and dry. Apply creams and ointments as told by your health care provider. ? You may need to use breast pads to absorb milk that leaks from your breasts. ? You may have uterine contractions every time you breastfeed for up to several weeks after delivery. Uterine contractions help your uterus return to its normal size. ? If you have any problems with breastfeeding, work with your health care provider or lactation consultant.  If you are not breastfeeding: ? Avoid touching your breasts a lot. Doing this can make   your breasts produce more milk. ? Wear a good-fitting bra and use cold packs to help with swelling. ? Do not squeeze out (express) milk. This causes you to make more milk. Intimacy and sexuality  Ask your health care provider when you can engage in sexual activity. This may depend on: ? Your risk of infection. ? How fast you are healing. ? Your comfort and desire to engage in sexual activity.  You are able to get pregnant after delivery, even if you have not had  your period. If desired, talk with your health care provider about methods of birth control (contraception). Medicines  Take over-the-counter and prescription medicines only as told by your health care provider.  If you were prescribed an antibiotic medicine, take it as told by your health care provider. Do not stop taking the antibiotic even if you start to feel better. Activity  Gradually return to your normal activities as told by your health care provider. Ask your health care provider what activities are safe for you.  Rest as much as possible. Try to rest or take a nap while your baby is sleeping. Eating and drinking   Drink enough fluid to keep your urine pale yellow.  Eat high-fiber foods every day. These may help prevent or relieve constipation. High-fiber foods include: ? Whole grain cereals and breads. ? Brown rice. ? Beans. ? Fresh fruits and vegetables.  Do not try to lose weight quickly by cutting back on calories.  Take your prenatal vitamins until your postpartum checkup or until your health care provider tells you it is okay to stop. Lifestyle  Do not use any products that contain nicotine or tobacco, such as cigarettes and e-cigarettes. If you need help quitting, ask your health care provider.  Do not drink alcohol, especially if you are breastfeeding. General instructions  Keep all follow-up visits for you and your baby as told by your health care provider. Most women visit their health care provider for a postpartum checkup within the first 3-6 weeks after delivery. Contact a health care provider if:  You feel unable to cope with the changes that your child brings to your life, and these feelings do not go away.  You feel unusually sad or worried.  Your breasts become red, painful, or hard.  You have a fever.  You have trouble holding urine or keeping urine from leaking.  You have little or no interest in activities you used to enjoy.  You have not  breastfed at all and you have not had a menstrual period for 12 weeks after delivery.  You have stopped breastfeeding and you have not had a menstrual period for 12 weeks after you stopped breastfeeding.  You have questions about caring for yourself or your baby.  You pass a blood clot from your vagina. Get help right away if:  You have chest pain.  You have difficulty breathing.  You have sudden, severe leg pain.  You have severe pain or cramping in your lower abdomen.  You bleed from your vagina so much that you fill more than one sanitary pad in one hour. Bleeding should not be heavier than your heaviest period.  You develop a severe headache.  You faint.  You have blurred vision or spots in your vision.  You have bad-smelling vaginal discharge.  You have thoughts about hurting yourself or your baby. If you ever feel like you may hurt yourself or others, or have thoughts about taking your own life, get help  right away. You can go to the nearest emergency department or call:  Your local emergency services (911 in the [place]  A suicide crisis helpline, such as the National Suicide Prevention Lifeline at (404)536-6988. This is open 24 hours a day. Summary  The period of time right after you deliver your newborn up to 6-12 weeks after delivery is called the postpartum period.  Gradually return to your normal activities as told by your health care provider.  Keep all follow-up visits for you and your baby as told by your health care provider. This information is not intended to replace advice given to you by your health care provider. Make sure you discuss any questions you have with your health care provider. Document Revised: 02/19/2017 Document Reviewed: 11/30/2016 Elsevier Patient Education  2020 [employer]   Preeclampsia and Eclampsia Preeclampsia is a serious condition that may develop during pregnancy. This condition causes high blood pressure and increased  protein in your urine along with other symptoms, such as headaches and vision changes. These symptoms may develop as the condition gets worse. Preeclampsia may occur at 20 weeks of pregnancy or later. Diagnosing and treating preeclampsia early is very important. If not treated early, it can cause serious problems for you and your baby. One problem it can lead to is eclampsia. Eclampsia is a condition that causes muscle jerking or shaking (convulsions or seizures) and other serious problems for the mother. During pregnancy, delivering your baby may be the best treatment for preeclampsia or eclampsia. For most women, preeclampsia and eclampsia symptoms go away after giving birth. In rare cases, a woman may develop preeclampsia after giving birth (postpartum preeclampsia). This usually occurs within 48 hours after childbirth but may occur up to 6 weeks after giving birth. What are the causes? The cause of preeclampsia is not known. What increases the risk? The following risk factors make you more likely to develop preeclampsia:  Being pregnant for the first time.  Having had preeclampsia during a past pregnancy.  Having a family history of preeclampsia.  Having high blood pressure.  Being pregnant with more than one baby.  Being 97 or older.  Being African-American.  Having kidney disease or diabetes.  Having medical conditions such as lupus or blood diseases.  Being very overweight (obese). What are the signs or symptoms? The most common symptoms are:  Severe headaches.  Vision problems, such as blurred or double vision.  Abdominal pain, especially upper abdominal pain. Other symptoms that may develop as the condition gets worse include:  Sudden weight gain.  Sudden swelling of the hands, face, legs, and feet.  Severe nausea and vomiting.  Numbness in the face, arms, legs, and feet.  Dizziness.  Urinating less than usual.  Slurred speech.  Convulsions or  seizures. How is this diagnosed? There are no screening tests for preeclampsia. Your health care provider will ask you about symptoms and check for signs of preeclampsia during your prenatal visits. You may also have tests that include:  Checking your blood pressure.  Urine tests to check for protein. Your health care provider will check for this at every prenatal visit.  Blood tests.  Monitoring your baby's heart rate.  Ultrasound. How is this treated? You and your health care provider will determine the treatment approach that is best for you. Treatment may include:  Having more frequent prenatal exams to check for signs of preeclampsia, if you have an increased risk for preeclampsia.  Medicine to lower your blood pressure.  Staying  hospital, if your condition is severe. There, treatment will focus on controlling your blood pressure and the amount of fluids in your body (fluid retention).  Taking medicine (magnesium sulfate) to prevent seizures. This may be given as an injection or through an IV.  Taking a low-dose aspirin during your pregnancy.  Delivering your baby early. You may have your labor started with medicine (induced), or you may have a cesarean delivery. Follow these instructions at home: Eating and drinking   Drink enough fluid to keep your urine pale yellow.  Avoid caffeine. Lifestyle  Do not use any products that contain nicotine or tobacco, such as cigarettes and e-cigarettes. If you need help quitting, ask your health care provider.  Do not use alcohol or drugs.  Avoid stress as much as possible. Rest and get plenty of sleep. General instructions  Take over-the-counter and prescription medicines only as told by your health care provider.  When lying down, lie on your left side. This keeps pressure off your major blood vessels.  When sitting or lying down, raise (elevate) your feet. Try putting some pillows underneath your lower legs.  Exercise regularly. Ask  your health care provider what kinds of exercise are best for you.  Keep all follow-up and prenatal visits as told by your health care provider. This is important. How is this prevented? There is no known way of preventing preeclampsia or eclampsia from developing. However, to lower your risk of complications and detect problems early:  Get regular prenatal care. Your health care provider may be able to diagnose and treat the condition early.  Maintain a healthy weight. Ask your health care provider for help managing weight gain during pregnancy.  Work with your health care provider to manage any long-term (chronic) health conditions you have, such as diabetes or kidney problems.  You may have tests of your blood pressure and kidney function after giving birth.  Your health care provider may have you take low-dose aspirin during your next pregnancy. Contact a health care provider if:  You have symptoms that your health care provider told you may require more treatment or monitoring, such as: ? Headaches. ? Nausea or vomiting. ? Abdominal pain. ? Dizziness. ? Light-headedness. Get help right away if:  You have severe: ? Abdominal pain. ? Headaches that do not get better. ? Dizziness. ? Vision problems. ? Confusion. ? Nausea or vomiting.  You have any of the following: ? A seizure. ? Sudden, rapid weight gain. ? Sudden swelling in your hands, ankles, or face. ? Trouble moving any part of your body. ? Numbness in any part of your body. ? Trouble speaking. ? Abnormal bleeding.  You faint. Summary  Preeclampsia is a serious condition that may develop during pregnancy.  This condition causes high blood pressure and increased protein in your urine along with other symptoms, such as headaches and vision changes.  Diagnosing and treating preeclampsia early is very important. If not treated early, it can cause serious problems for you and your baby.  Get help right away if  you have symptoms that your health care provider told you to watch for. This information is not intended to replace advice given to you by your health care provider. Make sure you discuss any questions you have with your health care provider. Document Revised: 10/19/2017 Document Reviewed: 09/23/2015 Elsevier Patient Education  2020 Elsevier Inc.  

## 2020-01-29 NOTE — Lactation Note (Signed)
This note was copied from a baby's chart. Lactation Consultation Note  Patient Name: Dana Welch Today's Date: 01/29/2020 Reason for consult: Follow-up assessment   P1 mother whose infant is now 73 hours old.  This is a term baby at 39+0 weeks.  Mother had baby latched to the right breast when I arrived.  Baby was very sleepy but mother had him swaddled.  Suggested unswaddling to try to awaken him.  Mother agreeable.  I demonstrated how to awaken him and he began rooting.  Assisted to latch to the right breast easily.  Baby had a wide gape and flanged lips.  His suck was rhythmic and nutritive.  Occasional swallows noted and mother denied pain with latching.  With stimulation he continued sucking in a relaxed manner.  Baby was still feeding well when I left the room after 10 minutes.  Engorgement prevention/treatment discussed.  Mother has a manual pump and a DEBP for home use.  Mother has our OP phone number for any concerns after discharge.  Father present.   Maternal Data    Feeding Feeding Type: Breast Fed  LATCH Score Latch: Grasps breast easily, tongue down, lips flanged, rhythmical sucking.  Audible Swallowing: A few with stimulation  Type of Nipple: Everted at rest and after stimulation  Comfort (Breast/Nipple): Soft / non-tender  Hold (Positioning): Assistance needed to correctly position infant at breast and maintain latch.  LATCH Score: 8  Interventions Interventions: Breast feeding basics reviewed;Assisted with latch;Skin to skin;Breast massage;Hand express;Breast compression;Adjust position;Coconut oil;Position options;Support pillows  Lactation Tools Discussed/Used     Consult Status Consult Status: Complete Date: 01/29/20 Follow-up type: Call as needed    Cannen Dupras R Starling Jessie 01/29/2020, 9:03 AM

## 2020-01-29 NOTE — Discharge Summary (Signed)
Postpartum Discharge Summary      Patient Name: Dana Welch DOB: December 23, 1995 MRN: [id number]  Date of admission: 01/27/2020 Delivery date:01/27/2020  Delivering provider: Drema Dallas  Date of discharge: 01/29/2020  Admitting diagnosis: Normal labor [O80, Z37.9] Intrauterine pregnancy: [redacted]w[redacted]d    Secondary diagnosis:  Active Problems:   Normal labor  Additional problems: Anxiety    Discharge diagnosis: Term Pregnancy Delivered                                              Post partum procedures:None Augmentation: AROM and Pitocin Complications: None  Hospital course: Onset of Labor With Vaginal Delivery      24y.o. yo G1P1001 at 326w0das admitted in Active Labor on 01/27/2020. Patient had an uncomplicated labor course as follows:  Membrane Rupture Time/Date: 2:29 PM ,01/27/2020   Delivery Method:Vaginal, Spontaneous  Episiotomy: None  Lacerations:  Periurethral;2nd degree;Labial  Patient had an uncomplicated postpartum course.  She is ambulating, tolerating a regular diet, passing flatus, and urinating well. Patient is discharged home in stable condition on 02/01/20.  Newborn Data: Birth date:01/27/2020  Birth time:9:58 PM  Gender:Female  Living status:Living  Apgars:7 ,8  Weight:3.68 kg   Magnesium Sulfate received: No BMZ received: No Rhophylac:No MMR:No T-DaP: See prenatal chart Flu: See prenatal chart Transfusion:No  Physical exam  Vitals:   01/28/20 1031 01/28/20 1450 01/28/20 2019 01/29/20 0530  BP: 118/68 130/73 118/74 126/82  Pulse: 78 79 83 79  Resp: _0 Temp: 98.5 F (36.9 C) 98.7 F (37.1 C) 98.4 F (36.9 C) 98.4 F (36.9 C)  TempSrc: Oral Oral Oral Oral  SpO2:   99% 99%  Weight:      Height:       General: alert, cooperative, and no distress Lochia: appropriate Uterine Fundus: firm Incision: N/A DVT Evaluation: No evidence of DVT seen on physical exam. Calf/Ankle edema is present Labs: Lab Results  Component Value Date    WBC 13.9 (H) 01/28/2020   HGB 10.0 (L) 01/28/2020   HCT 29.1 (L) 01/28/2020   MCV 91.2 01/28/2020   PLT 118 (L) 01/28/2020   CMP Latest Ref Rng & Units 01/28/2020  Glucose 70 - 99 mg/dL 81  BUN 6 - 20 mg/dL 13  Creatinine 0.44 - 1.00 mg/dL 0.87  Sodium 135 - 145 mmol/L 136  Potassium 3.5 - 5.1 mmol/L 3.9  Chloride 98 - 111 mmol/L 104  CO2 22 - 32 mmol/L 20(L)  Calcium 8.9 - 10.3 mg/dL 8.5(L)  Total Protein 6.5 - 8.1 g/dL 5.2(L)  Total Bilirubin 0.3 - 1.2 mg/dL 0.9  Alkaline Phos 38 - 126 U/L 133(H)  AST 15 - 41 U/L 45(H)  ALT 0 - 44 U/L 31   Edinburgh Score: Edinburgh Postnatal Depression Scale Screening Tool 01/28/2020  I have been able to laugh and see the funny side of things. 0  I have looked forward with enjoyment to things. 0  I have blamed myself unnecessarily when things went wrong. 1  I have been anxious or worried for no good reason. 1  I have felt scared or panicky for no good reason. 0  Things have been getting on top of me. 0  I have been so unhappy that I have had difficulty sleeping. 0  I have felt sad or miserable. 0  I have been  so unhappy that I have been crying. 0  The thought of harming myself has occurred to me. 0  Edinburgh Postnatal Depression Scale Total 2      After visit meds:  Allergies as of 01/29/2020   No Known Allergies      Medication List     STOP taking these medications    escitalopram 10 MG tablet Commonly known as: Lexapro       TAKE these medications    ibuprofen 800 MG tablet Commonly known as: ADVIL Take 1 tablet (800 mg total) by mouth every 8 (eight) hours as needed.   levothyroxine 50 MCG tablet Commonly known as: SYNTHROID Take 1 tablet (50 mcg total) by mouth daily. Take at least 30 minutes prior to eating breakfast.   PRENATAL VITAMINS PO Take 1 tablet by mouth daily.   sertraline 50 MG tablet Commonly known as: Zoloft Take 1 tablet (50 mg total) by mouth daily.   valACYclovir 500 MG  tablet Commonly known as: VALTREX Take one tablet (500 mg) by mouth daily for infection prevention.  Take one tablet (500 mg) by mouth twice a day for 3 days as needed.         Discharge home in stable condition Infant Feeding:  See notes Infant Disposition:home with mother Discharge instruction: per After Visit Summary and Postpartum booklet. Activity: Advance as tolerated. Pelvic rest for 6 weeks.  Diet: routine diet Anticipated Birth Control: IUD Postpartum Appointment:1 week Additional Postpartum F/U: BP check 1 week Future Appointments: Future Appointments  Date Time Provider Department Center  02/03/2020  9:45 AM [facility] None   Follow up Visit:  Follow-up Information     Cole, Tara, MD Follow up in 6 week(s).   Specialty: Obstetrics and Gynecology Why: 1 week blood pressure check. Contact information: [address] [place]  [address] [phone]                     01/29/2020 Evelyn Varnado, MD  

## 2020-01-31 DIAGNOSIS — U071 COVID-19: Secondary | ICD-10-CM

## 2020-01-31 HISTORY — DX: COVID-19: U07.1

## 2020-02-03 ENCOUNTER — Other Ambulatory Visit (HOSPITAL_COMMUNITY): Payer: PRIVATE HEALTH INSURANCE

## 2020-02-05 ENCOUNTER — Inpatient Hospital Stay (HOSPITAL_COMMUNITY): Payer: PRIVATE HEALTH INSURANCE

## 2020-02-05 ENCOUNTER — Inpatient Hospital Stay (HOSPITAL_COMMUNITY)
Admission: AD | Admit: 2020-02-05 | Payer: PRIVATE HEALTH INSURANCE | Source: Home / Self Care | Admitting: Obstetrics and Gynecology

## 2020-03-21 ENCOUNTER — Ambulatory Visit (INDEPENDENT_AMBULATORY_CARE_PROVIDER_SITE_OTHER): Payer: PRIVATE HEALTH INSURANCE | Admitting: Obstetrics and Gynecology

## 2020-03-21 ENCOUNTER — Other Ambulatory Visit: Payer: Self-pay

## 2020-03-21 ENCOUNTER — Encounter: Payer: Self-pay | Admitting: Obstetrics and Gynecology

## 2020-03-21 VITALS — BP 110/68 | Ht 64.0 in | Wt 142.0 lb

## 2020-03-21 DIAGNOSIS — E039 Hypothyroidism, unspecified: Secondary | ICD-10-CM

## 2020-03-21 DIAGNOSIS — Z01419 Encounter for gynecological examination (general) (routine) without abnormal findings: Secondary | ICD-10-CM | POA: Diagnosis not present

## 2020-03-21 MED ORDER — VALACYCLOVIR HCL 500 MG PO TABS
ORAL_TABLET | ORAL | 3 refills | Status: DC
Start: 2020-03-21 — End: 2020-06-04

## 2020-03-21 MED ORDER — LEVOTHYROXINE SODIUM 50 MCG PO TABS
50.0000 ug | ORAL_TABLET | Freq: Every day | ORAL | 3 refills | Status: DC
Start: 2020-03-21 — End: 2020-06-04

## 2020-03-21 MED ORDER — SERTRALINE HCL 100 MG PO TABS
100.0000 mg | ORAL_TABLET | Freq: Every day | ORAL | 3 refills | Status: DC
Start: 2020-03-21 — End: 2020-06-04

## 2020-03-21 NOTE — Progress Notes (Signed)
25 y.o. [id number] Married Caucasian female here for annual exam.   She started POP a few days ago.  She is taking Zoloft 100 mg daily.  Had baby boy 2 months ago.  8 pounds, 1 ounce.  Breast feeding.   Received her Covid vaccine and flu vaccine.  Declines booster for Covid currently.  Had Covid in December, 2021.   PCP:  Hillard Danker, MD  Patient's last menstrual period was 04/29/2019 (exact date).     Period Cycle (Days):  (Breastfeeding)     Sexually active: No.  The current method of family planning is recently had baby and hasn't had intercourse yet/OCP.    Exercising: No.  The patient does not participate in regular exercise at present. Smoker:  no  Health Maintenance: Pap: 10-26-18 Neg, 08-28-15 Neg History of abnormal Pap:  no MMG:  11-24-19 U/S Lt.Axilla/Neg/BiRads2  Colonoscopy:  n/a BMD:   n/a  Result  n/a TDaP:  2017 Gardasil:   yes HIV: Neg in preg Hep C:no Screening Labs:  Declines.    reports that she has never smoked. She has never used smokeless tobacco. She reports previous alcohol use. She reports that she does not use drugs.  Past Medical History:  Diagnosis Date   ADHD (attention deficit hyperactivity disorder)    Anxiety    COVID 01/2020   Depression    Dysmenorrhea    Elevated TSH    HSV-1 infection    Hypothyroidism    UTI (lower urinary tract infection)      Past Surgical History:  Procedure Laterality Date   WISDOM TOOTH EXTRACTION      Current Outpatient Medications  Medication Sig Dispense Refill   INCASSIA 0.35 MG tablet Take by mouth.     levothyroxine (SYNTHROID) 50 MCG tablet Take 1 tablet (50 mcg total) by mouth daily. Take at least 30 minutes prior to eating breakfast. 90 tablet 3   sertraline (ZOLOFT) 100 MG tablet Take 1 tablet (100 mg total) by mouth daily. 90 tablet 3   valACYclovir (VALTREX) 500 MG tablet Take one tablet (500 mg) by mouth daily for infection prevention.  Take one tablet (500 mg) by mouth  twice a day for 3 days as needed. 100 tablet 3   No current facility-administered medications for this visit.    Family History  Problem Relation Age of Onset   Thyroid disease Mother    Heart attack Maternal Grandfather    Rashes / Skin problems Maternal Grandfather    Cancer Paternal Grandmother        kidney   Hypertension Father     Review of Systems  All other systems reviewed and are negative.   Exam:   BP 110/68    Ht 5\' 4"  (1.626 m)    Wt 142 lb (64.4 kg)    LMP 04/29/2019 (Exact Date)    Breastfeeding Yes    BMI 24.37 kg/m     General appearance: alert, cooperative and appears stated age Head: normocephalic, without obvious abnormality, atraumatic Neck: no adenopathy, supple, symmetrical, trachea midline and thyroid normal to inspection and palpation Lungs: clear to auscultation bilaterally Breasts: normal appearance, no masses or tenderness, No nipple retraction or dimpling, No nipple discharge or bleeding, No axillary adenopathy Heart: regular rate and rhythm Abdomen: soft, non-tender; no masses, no organomegaly Extremities: extremities normal, atraumatic, no cyanosis or edema Skin: skin color, texture, turgor normal. No rashes or lesions Lymph nodes: cervical, supraclavicular, and axillary nodes normal. Neurologic: grossly normal  Pelvic: External  genitalia:  no lesions              No abnormal inguinal nodes palpated.              Urethra:  normal appearing urethra with no masses, tenderness or lesions              Bartholins and Skenes: normal                 Vagina: normal appearing vagina with normal color and discharge, no lesions              Cervix: no lesions              Pap taken: No. Bimanual Exam:  Uterus:  normal size, contour, position, consistency, mobility, non-tender              Adnexa: no mass, fullness, tenderness               Chaperone was present for exam.  Assessment:   Well woman visit with normal exam. Hypothyroidism.  Hx  HSV.  Hx anxiety and depression.   Plan: Mammogram screening discussed. Self breast awareness reviewed. Pap and HR HPV as above. Guidelines for Calcium, Vitamin D, regular exercise program including cardiovascular and weight bearing exercise. Refill of Micronor birth control pills, Zoloft, and Valtrex.  All ok for breast feeding.  Check thyroid function.  Follow up annually and prn.

## 2020-03-22 LAB — TSH: TSH: 2.48 mIU/L

## 2020-03-22 LAB — T4, FREE: Free T4: 1.1 ng/dL (ref 0.8–1.8)

## 2020-03-24 NOTE — Patient Instructions (Signed)
EXERCISE AND DIET:  We recommended that you start or continue a regular exercise program for good health. Regular exercise means any activity that makes your heart beat faster and makes you sweat.  We recommend exercising at least 30 minutes per day at least 3 days a week, preferably 4 or 5.  We also recommend a diet low in fat and sugar.  Inactivity, poor dietary choices and obesity can cause diabetes, heart attack, stroke, and kidney damage, among others.    ALCOHOL AND SMOKING:  Women should limit their alcohol intake to no more than 7 drinks/beers/glasses of wine (combined, not each!) per week. Moderation of alcohol intake to this level decreases your risk of breast cancer and liver damage. And of course, no recreational drugs are part of a healthy lifestyle.  And absolutely no smoking or even second hand smoke. Most people know smoking can cause heart and lung diseases, but did you know it also contributes to weakening of your bones? Aging of your skin?  Yellowing of your teeth and nails?  CALCIUM AND VITAMIN D:  Adequate intake of calcium and Vitamin D are recommended.  The recommendations for exact amounts of these supplements seem to change often, but generally speaking 600 mg of calcium (either carbonate or citrate) and 800 units of Vitamin D per day seems prudent. Certain women may benefit from higher intake of Vitamin D.  If you are among these women, your doctor will have told you during your visit.    PAP SMEARS:  Pap smears, to check for cervical cancer or precancers,  have traditionally been done yearly, although recent scientific advances have shown that most women can have pap smears less often.  However, every woman still should have a physical exam from her gynecologist every year. It will include a breast check, inspection of the vulva and vagina to check for abnormal growths or skin changes, a visual exam of the cervix, and then an exam to evaluate the size and shape of the uterus and  ovaries.  And after 27 years of age, a rectal exam is indicated to check for rectal cancers. We will also provide age appropriate advice regarding health maintenance, like when you should have certain vaccines, screening for sexually transmitted diseases, bone density testing, colonoscopy, mammograms, etc.   MAMMOGRAMS:  All women over 27 years old should have a yearly mammogram. Many facilities now offer a "3D" mammogram, which may cost around $50 extra out of pocket. If possible,  we recommend you accept the option to have the 3D mammogram performed.  It both reduces the number of women who will be called back for extra views which then turn out to be normal, and it is better than the routine mammogram at detecting truly abnormal areas.    COLONOSCOPY:  Colonoscopy to screen for [place] cancer is recommended for all women at age 27.  We know, you hate the idea of the prep.  We agree, BUT, having Pitstick cancer and not knowing it is worse!!  [place]ibeaux cancer so often starts as a polyp that can be seen and removed at colonscopy, which can quite literally save your life!  And if your first colonoscopy is normal and you have no family history of [place] cancer, most women don't have to have it again for 10 years.  Once every ten years, you can do something that may end up saving your life, right?  We will be happy to help you get it scheduled when you are ready.    Be sure to check your insurance coverage so you understand how much it will cost.  It may be covered as a preventative service at no cost, but you should check your particular policy.      

## 2020-04-18 ENCOUNTER — Telehealth: Payer: Self-pay | Admitting: *Deleted

## 2020-04-18 NOTE — Telephone Encounter (Signed)
Patient called and left message in triage to call her, I called and receive her voicemail, message left to call me.

## 2020-05-21 ENCOUNTER — Encounter: Payer: Self-pay | Admitting: Obstetrics and Gynecology

## 2020-06-04 ENCOUNTER — Encounter: Payer: Self-pay | Admitting: Obstetrics and Gynecology

## 2020-06-04 MED ORDER — LEVOTHYROXINE SODIUM 50 MCG PO TABS: 50.0000 ug | ORAL_TABLET | Freq: Every day | ORAL | 2 refills | Status: AC

## 2020-06-04 MED ORDER — SERTRALINE HCL 100 MG PO TABS: 100.0000 mg | ORAL_TABLET | Freq: Every day | ORAL | 2 refills | Status: AC

## 2020-06-04 MED ORDER — VALACYCLOVIR HCL 500 MG PO TABS
ORAL_TABLET | ORAL | 2 refills | Status: DC
Start: 2020-06-04 — End: 2021-02-19

## 2020-11-05 ENCOUNTER — Ambulatory Visit (INDEPENDENT_AMBULATORY_CARE_PROVIDER_SITE_OTHER): Payer: No Typology Code available for payment source | Admitting: Internal Medicine

## 2020-11-05 ENCOUNTER — Other Ambulatory Visit: Payer: Self-pay

## 2020-11-05 ENCOUNTER — Encounter: Payer: Self-pay | Admitting: Internal Medicine

## 2020-11-05 VITALS — BP 118/80 | HR 75 | Temp 97.8°F | Resp 18 | Ht 64.0 in | Wt 138.6 lb

## 2020-11-05 DIAGNOSIS — E039 Hypothyroidism, unspecified: Secondary | ICD-10-CM | POA: Diagnosis not present

## 2020-11-05 DIAGNOSIS — F411 Generalized anxiety disorder: Secondary | ICD-10-CM | POA: Diagnosis not present

## 2020-11-05 DIAGNOSIS — Z0000 Encounter for general adult medical examination without abnormal findings: Secondary | ICD-10-CM | POA: Diagnosis not present

## 2020-11-05 LAB — TSH: TSH: 6.67 u[IU]/mL — ABNORMAL HIGH (ref 0.35–5.50)

## 2020-11-05 LAB — T4, FREE: Free T4: 0.87 ng/dL (ref 0.60–1.60)

## 2020-11-05 NOTE — Patient Instructions (Signed)
We will check the thyroid levels today.  If those are normal we may try stopping the thyroid medicine and then recheck levels in 2-3 months.

## 2020-11-05 NOTE — Progress Notes (Signed)
   Subjective:   Patient ID: Dana Welch, female    DOB: 1996-02-23, 25 y.o.   MRN: [id number]  HPI The patient is a 25 YO female coming in for physical and some other new concerns.   New thyroid medicine from ob/gyn since our last visit 2 years ago as well as wants maybe change in zoloft. Is currently taking progesterone only OCP due to breastfeeding although she has stopped this now.   PMH, [facility], social history reviewed and updated  Review of Systems  Constitutional: Negative.   HENT: Negative.    Eyes: Negative.   Respiratory:  Negative for cough, chest tightness and shortness of breath.   Cardiovascular:  Negative for chest pain, palpitations and leg swelling.  Gastrointestinal:  Negative for abdominal distention, abdominal pain, constipation, diarrhea, nausea and vomiting.  Musculoskeletal: Negative.   Skin: Negative.   Neurological: Negative.   Psychiatric/Behavioral:  Positive for decreased concentration and dysphoric mood. The patient is nervous/anxious.    Objective:  Physical Exam Constitutional:      Appearance: She is well-developed.  HENT:     Head: Normocephalic and atraumatic.  Cardiovascular:     Rate and Rhythm: Normal rate and regular rhythm.  Pulmonary:     Effort: Pulmonary effort is normal. No respiratory distress.     Breath sounds: Normal breath sounds. No wheezing or rales.  Abdominal:     General: Bowel sounds are normal. There is no distension.     Palpations: Abdomen is soft.     Tenderness: There is no abdominal tenderness. There is no rebound.  Musculoskeletal:     Cervical back: Normal range of motion.  Skin:    General: Skin is warm and dry.  Neurological:     Mental Status: She is alert and oriented to person, place, and time.     Coordination: Coordination normal.    Vitals:   11/05/20 0848  BP: 118/80  Pulse: 75  Resp: 18  Temp: 97.8 F (36.6 C)  TempSrc: Oral  SpO2: 99%  Weight: 138 lb 9.6 oz (62.9 kg)  Height: 5\' 4"  (1.626 m)     This visit occurred during the SARS-CoV-2 public health emergency.  Safety protocols were in place, including screening questions prior to the visit, additional usage of staff PPE, and extensive cleaning of exam room while observing appropriate contact time as indicated for disinfecting solutions.   Assessment & Plan:

## 2020-11-07 ENCOUNTER — Encounter: Payer: Self-pay | Admitting: Internal Medicine

## 2020-11-08 DIAGNOSIS — E039 Hypothyroidism, unspecified: Secondary | ICD-10-CM | POA: Insufficient documentation

## 2020-11-08 NOTE — Assessment & Plan Note (Signed)
Flu shot yearly. Covid-19 booster recommended. Tetanus up to date. Pap smear up to date with gyn. Counseled about sun safety and mole surveillance. Counseled about the dangers of distracted driving. Given 10 year screening recommendations.

## 2020-11-08 NOTE — Assessment & Plan Note (Signed)
Unclear history, diagnosed with ob/gyn and was not abnormal (sounds like TSH was high but T4 normal) may have been done to help with conception as she was desiring this. There is family history of thyroid problems. Checking TSH and free T4 today. She is having some anxiety symptoms so if elevated or borderline high we can consider stopping her low dose thyroid medication synthroid 50 mcg daily with close recheck.

## 2020-11-08 NOTE — Assessment & Plan Note (Signed)
She was previously taking lexapro and is currently taking zoloft. We decided to check thyroid levels and if those are elevated to stop the thyroid medication and leave zoloft for now. Otherwise we can change zoloft to lexapro for better control of her anxiety.

## 2020-11-28 ENCOUNTER — Encounter: Payer: Self-pay | Admitting: Obstetrics and Gynecology

## 2021-02-19 ENCOUNTER — Encounter: Payer: Self-pay | Admitting: Obstetrics and Gynecology

## 2021-02-19 NOTE — Telephone Encounter (Signed)
She can take Valtrex 1000 mg daily for suppression.  #30, RF one.   For further refills, she would need to have an office visit.

## 2021-02-19 NOTE — Telephone Encounter (Signed)
Please contact the patient to obtain more information.

## 2021-02-19 NOTE — Telephone Encounter (Signed)
Dr.Silva patient is at work. Please see the above. She is taking Valtrex 500 mg daily for suppression dose and still having outbreaks

## 2021-02-20 MED ORDER — VALACYCLOVIR HCL 1 G PO TABS: 1000.0000 mg | ORAL_TABLET | Freq: Every day | ORAL | 1 refills | Status: DC

## 2021-03-17 ENCOUNTER — Other Ambulatory Visit: Payer: Self-pay

## 2021-03-17 MED ORDER — VALACYCLOVIR HCL 1 G PO TABS: 1000.0000 mg | ORAL_TABLET | Freq: Every day | ORAL | 0 refills | Status: AC

## 2021-03-17 NOTE — Telephone Encounter (Signed)
Last AEX 03/21/20. Patient was schedule but canceled appt on 03/27/21.  We tried to call patient to r/s appt but phone # on file not working.  I sent patient a My Chart message about scheduling AEX.

## 2021-03-27 ENCOUNTER — Ambulatory Visit: Payer: PRIVATE HEALTH INSURANCE | Admitting: Obstetrics and Gynecology

## 2021-04-01 ENCOUNTER — Other Ambulatory Visit: Payer: Self-pay

## 2021-05-26 ENCOUNTER — Other Ambulatory Visit: Payer: Self-pay

## 2022-08-11 IMAGING — US US AXILLARY LEFT
1 series · 6 of 6 positions shown · non-contrast
Comparison: None.

CLINICAL DATA: Left axillary swelling with some associated
tenderness. Patient is 7 months pregnant.

EXAM:
ULTRASOUND OF THE LEFT AXILLA

[Series 1: us axillary left · 0.06mm/px · 6 of 6 slices shown]
[im 1/6]
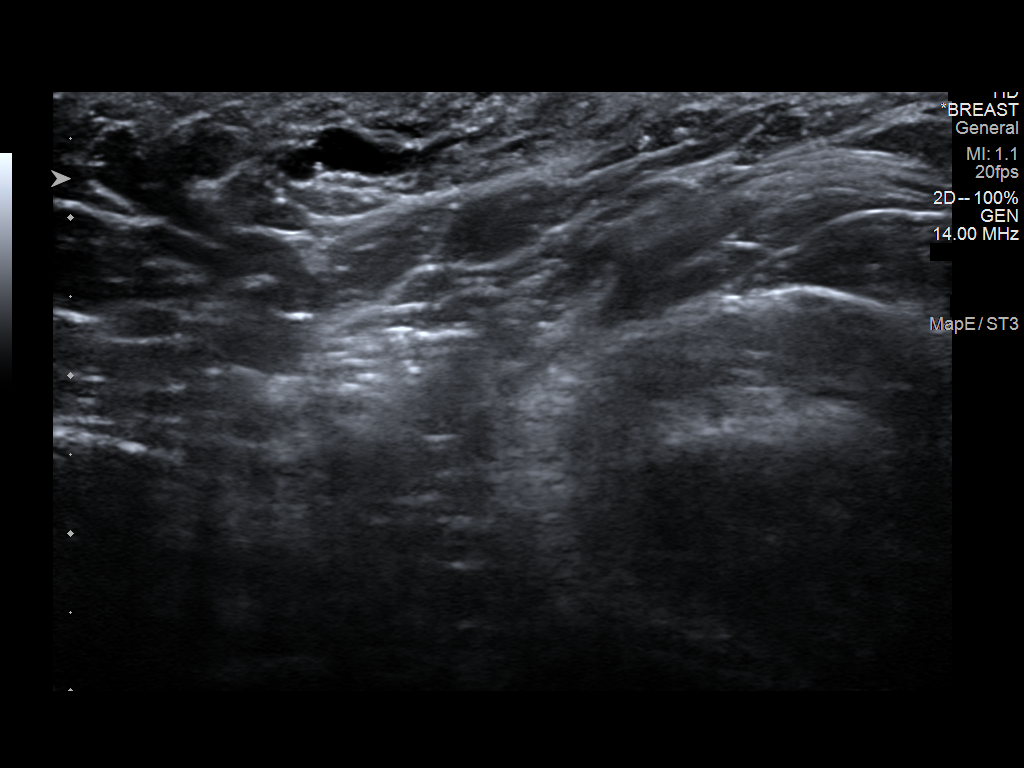
[im 2/6]
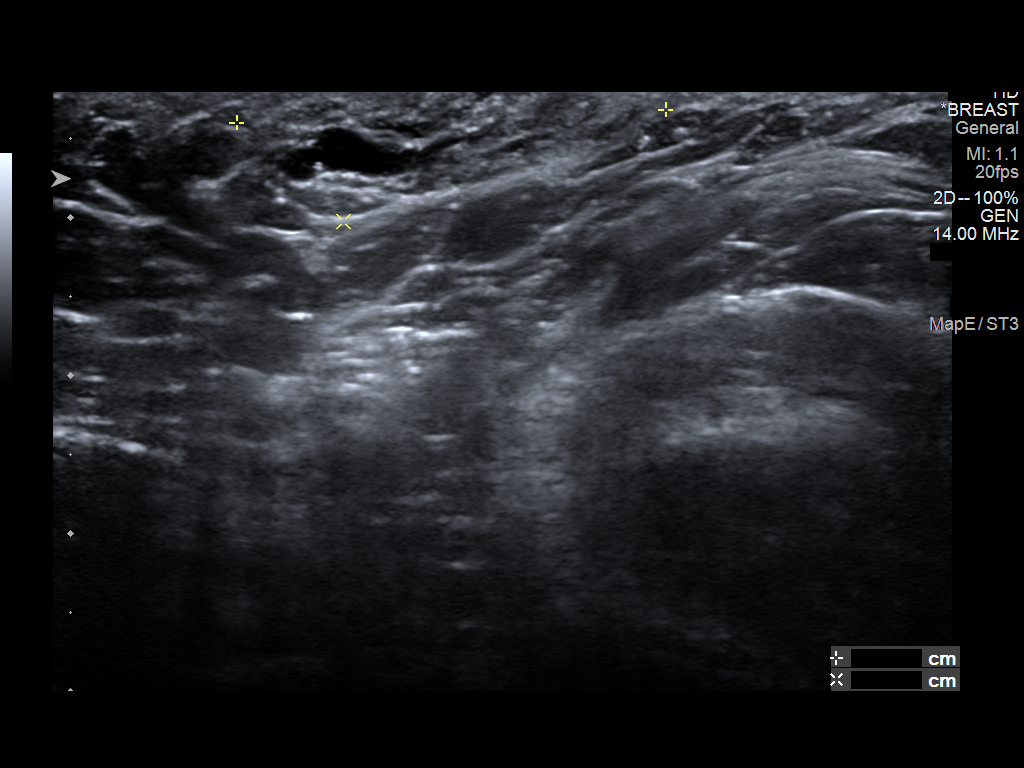
[im 3/6]
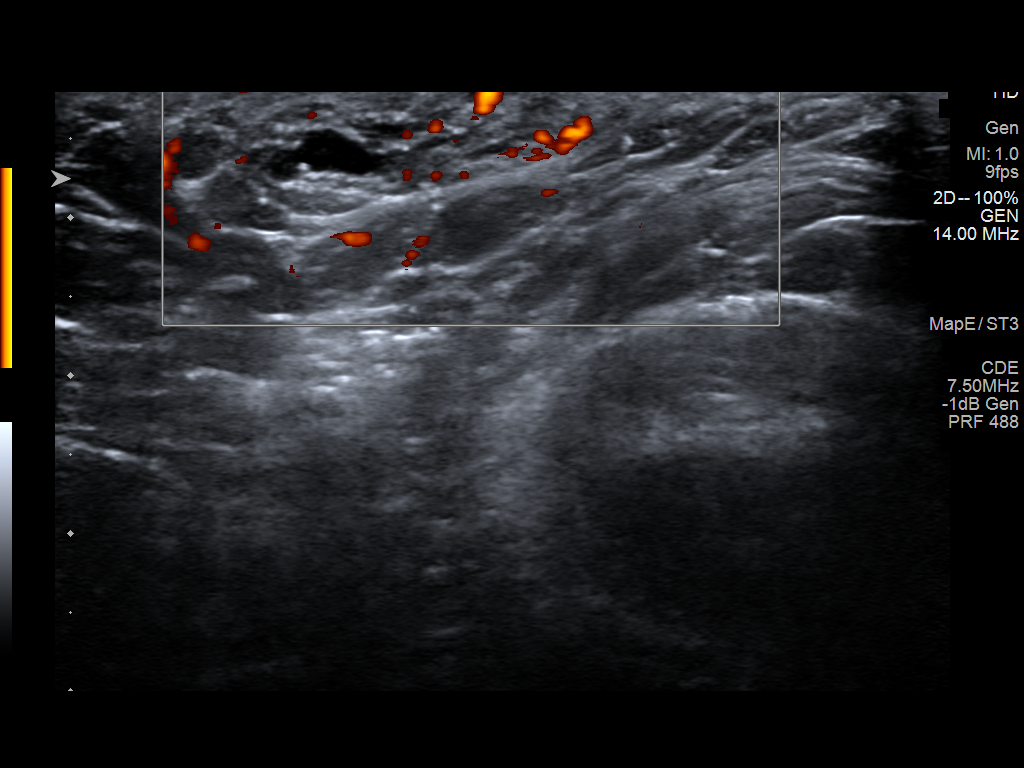
[im 4/6]
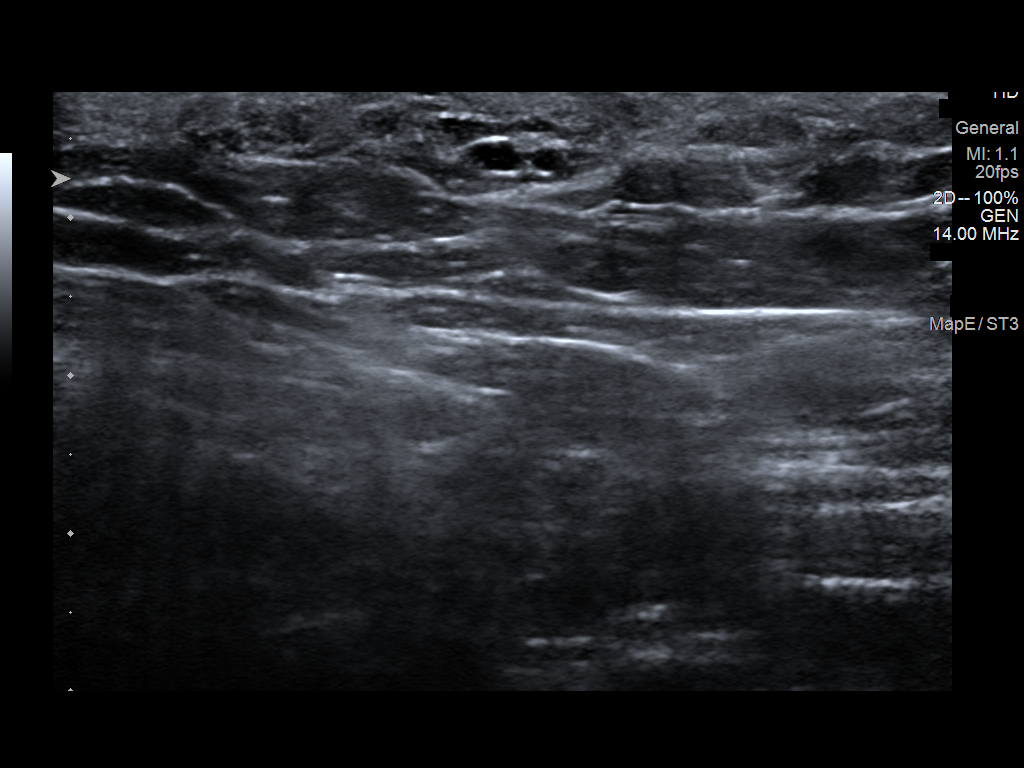
[im 5/6]
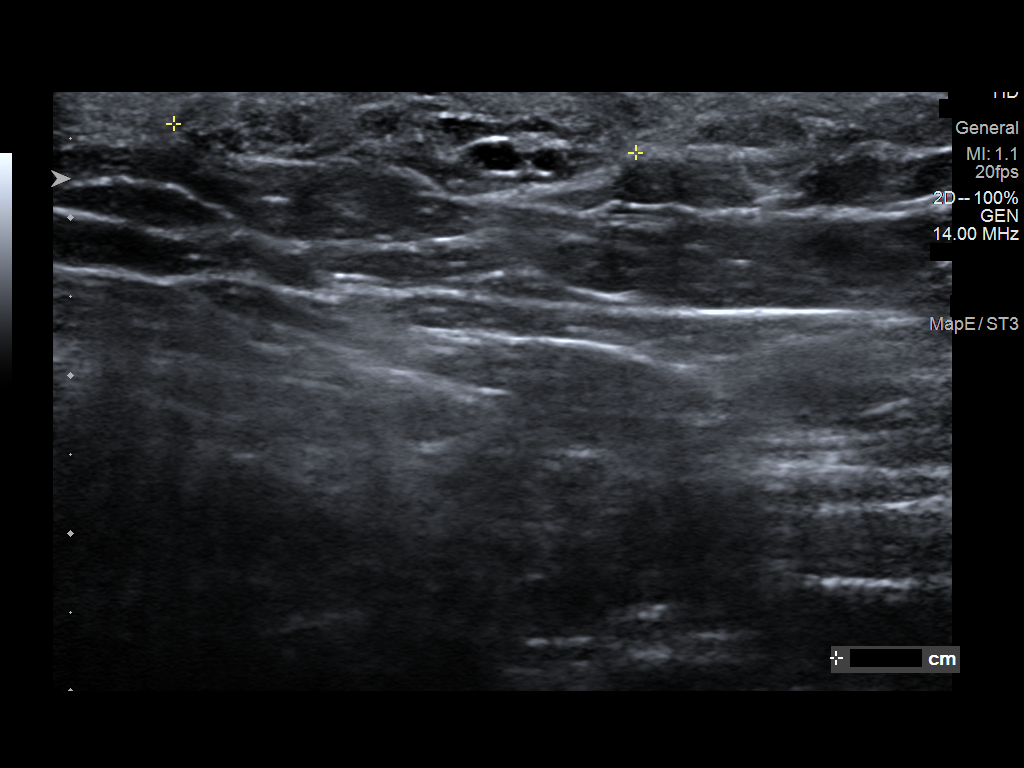
[im 6/6]
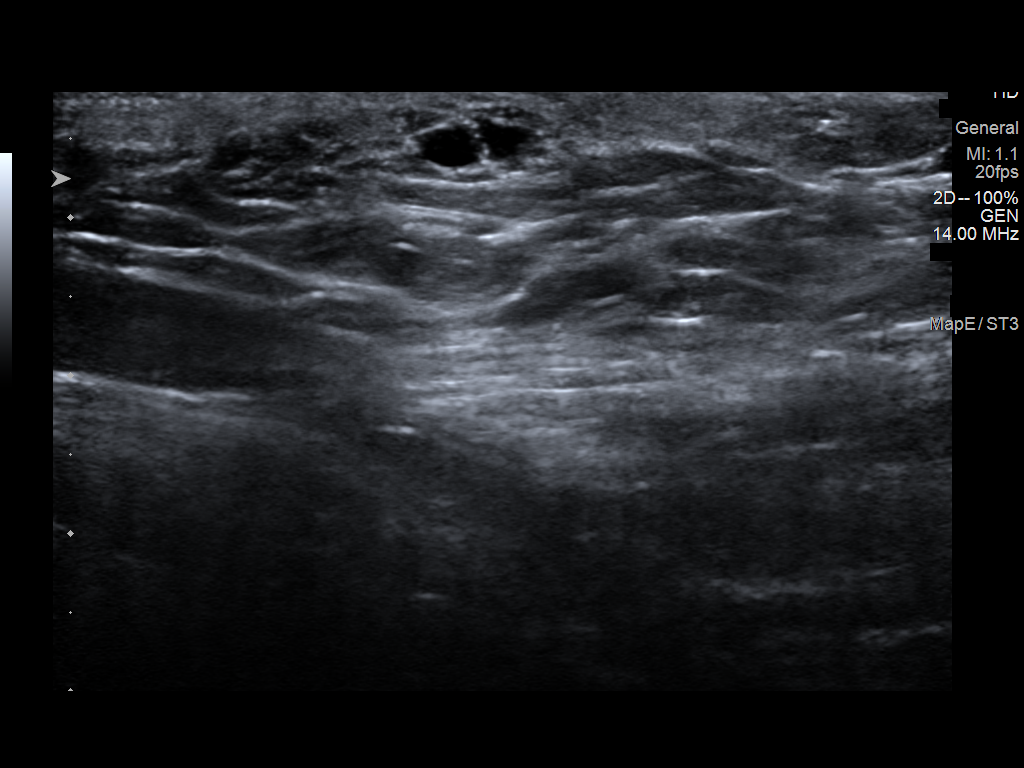

[6 of 6 positions shown; findings below may reference images not displayed]

FINDINGS: On physical exam,there is a superficial area of soft tissue
prominence in the left axilla with mild associated tenderness.

Ultrasound is performed, showing a heterogeneous but predominantly
hyperechoic area spanning approximately 2.8 x 0.9 x 2.9 cm. Anechoic
areas within this are consistent with ducts. Overall, this has the
appearance of normal fibroglandular tissue. There is no defined
mass.
IMPRESSION: 1. Benign area of left axilla fibroglandular tissue. No evidence of
breast malignancy.

RECOMMENDATION:
1. Screening mammogram at age 40 unless there are persistent or
intervening clinical concerns. (Code:[id number])
2. If the left axilla lump appears to be enlarging, repeat
diagnostic imaging would be recommended.

I have discussed the findings and recommendations with the patient.
If applicable, a reminder letter will be sent to the patient
regarding the next appointment.

BI-RADS CATEGORY  2: Benign.

## 2023-07-06 ENCOUNTER — Encounter: Payer: Self-pay | Admitting: Psychology

## 2023-07-06 ENCOUNTER — Ambulatory Visit (INDEPENDENT_AMBULATORY_CARE_PROVIDER_SITE_OTHER): Admitting: Psychology

## 2023-07-06 DIAGNOSIS — F902 Attention-deficit hyperactivity disorder, combined type: Secondary | ICD-10-CM | POA: Diagnosis not present

## 2023-07-06 DIAGNOSIS — F411 Generalized anxiety disorder: Secondary | ICD-10-CM | POA: Diagnosis not present

## 2023-07-06 NOTE — Progress Notes (Unsigned)
 [facility] Counselor Initial Adult Exam  Name: Dana Welch Date: 07/06/2023 MRN: [id number] DOB: 11/22/1995 PCP: Adelia Homestead, MD  Time spent: 12:05pm-1:05pm   Guardian/Payee:  ***    Paperwork requested: No   Reason for Visit /Presenting Problem: I have been struggling recently.  At age 30-5 and on medication through middle.  Off in high school.  Hyperactivity.  No meds through college.  Did ok w/ one kid nursing him.  Didn't start meds.  Adding on a second kids.  Its been difficult to function- irritate, late to everything.  And effecting my family and myself.  Very stressed out.  I;ll do a tsk and then repeat and do again.  I'm still nursing seonc for couple months.  Multi tasking is impossible. Im forgetting certain kids.  I'm hoping that can learn something to ry to help.   Nothing else stressor.  3 months.   Dropping both off.  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 Some days 5:30pm when just practice. Some nights not home- summer off.  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  Send all over.   Time at [employer] 7-6 long days.  Really loved being at that school,  Needs that structure.  He's energetic.  Loves friends teachers.    Pregnancy, birth similar face up- pushed 3 hours.  4 hours forcep delivery.  Poor apgar 3 weeks.  CIC appointment.    Cherry on top of it.  A lot going on.  Started the shower and started over.  Started all over.  Go upstairs.  So distracted. Go on for 10-15 minutes.    Mental Status Exam: Appearance:   {PSY:22683}     Behavior:  {PSY:21022743}  Motor:  {PSY:22302}  Speech/Language:   {PSY:22685}  Affect:  {PSY:22687}  Mood:  {PSY:31886}  Thought process:  {PSY:31888}  Thought content:    {PSY:808-798-2768}  Sensory/Perceptual disturbances:    {PSY:602-517-1868}  Orientation:  {PSY:30297}  Attention:  {PSY:22877}  Concentration:  {[phone]}  Memory:  {PSY:754-347-1344}  Fund of knowledge:   {[phone]}  Insight:    {[phone]}   Judgment:   {[phone]}  Impulse Control:  {[phone]}    Reported Symptoms:  more irritable.  Overwhelmed and overstimulated.  One at a time.  When get home in evenings.  A lot going on- get pancikly feeling.  Uncomforable feeling.  Not parenting well. Yelling at husbad.  Certraline.  Would like to come off.  Anxietys tems for the ADHd.  Get anxious when can't focus.  7 years ago.  50mg  started.  Postpartum w/ him first.  100mg . Wanting to figure out the aDhd. Forget and late.    Risk Assessment: Danger to Self:  {PSY:22692} Self-injurious Behavior: {PSY:22692} Danger to Others: {PSY:22692} Duty to Warn:{PSY:311194} Physical Aggression / Violence:{PSY:21197} Access to Firearms a concern: {PSY:21197} Gang Involvement:{PSY:21197} Patient / guardian was educated about steps to take if suicide or homicide risk level increases between visits: {Yes/No-Ex:120004} While future psychiatric events cannot be accurately predicted, the patient does not currently require acute inpatient psychiatric care and does not currently meet [place]  involuntary commitment criteria.  Substance Abuse History: Current substance abuse: {PSY:21197}    Past Psychiatric History:   {Past psych history:20559} Outpatient Providers:medication in past.   History of Psych Hospitalization: {PSY:21197} Psychological Testing: Attention/ADHD:  not reported.     Abuse History:  Victim of: {Abuse History:314532}, {Type of abuse:20566}  no truama. Liver cancer dad in 6 months.  His mom byself and had to support financially.  3-4  years later- his brohter form overdose.  Mom has moved to [place] and 18y/o brother not direction that like.  Living w/ sister in law.  Grandfather died w/ parkinsons 5 monhts agol.   Report needed: {PSY:314532} Victim of Neglect:{yes ZO:109604} Perpetrator of {PSY:20566}  Witness / Exposure to Domestic Violence: {PSY:21197}  Protective Services Involvement: {PSY:21197} Witness to  [employer] Violence:  {PSY:21197}  Family History:  Family History  Problem Relation Age of Onset   Thyroid  disease Mother    Heart attack Maternal Grandfather    Rashes / Skin problems Maternal Grandfather    Cancer Paternal Grandmother        kidney   Hypertension Father    Live in [place] now.  Grew up in [place] and sister 2 years younger.   Born in [place].  And move when he was 28y/o.   Talk w/ her daily sister.   Living situation: the patient {lives:315711::"lives with their family"}  Sexual Orientation: Straight  Relationship Status: married 13 years together.  Married 5 years.  Works for [employer].  High school.   Name of spouse / other:***  bicker more.  Controlling more.   If a parent, number of children / ages:2-  3.5 y/o 7 months today boys.  Very energetic.  Preschool. 91 month old w/ mom.  Nicu.  1 year in home sitter.    Support Systems: friend group.  Parents.    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  Maternity.   Military Service: No   Educational History: Education: Insurance account manager.    Religion/Sprituality/World View: Christian  Any cultural differences that may affect / interfere with treatment:  none  Recreation/Hobbies: athletic.  Golf together.   Quilt and sewing- my outlet.  Try to do 15 minutes.  And decompress.   Do a lot of family stuff and getting them together.  On weekends.    Stressors: {PATIENT STRESSORS:22669}  Strengths: {Patient Coping Strengths:[phone]}very positive  My attitude and deamenor.  Focus on tasks done.    Barriers:  ***   Legal History: Pending legal issue / charges: The patient has no significant history of legal issues. History of legal issue / charges:  none  Medical History/Surgical History: reviewed Past Medical History:  Diagnosis Date    ADHD (attention deficit hyperactivity disorder)    Anxiety    COVID 01/2020   Depression    Dysmenorrhea    Elevated TSH    HSV-1 infection    Hypothyroidism    UTI (lower urinary tract infection)     Past Surgical History:  Procedure Laterality Date   WISDOM TOOTH EXTRACTION      Medications: Current Outpatient Medications  Medication Sig Dispense Refill   INCASSIA 0.35 MG tablet Take by mouth.     levothyroxine  (SYNTHROID ) 50 MCG tablet Take 1 tablet (50 mcg total) by mouth daily. Take at least 30 minutes prior to eating breakfast. 90 tablet 2   sertraline  (ZOLOFT ) 100 MG tablet Take 1 tablet (100 mg total) by mouth daily. 90 tablet 2   valACYclovir  (VALTREX ) 1000 MG tablet Take 1 tablet (1,000 mg total) by mouth daily. 30 tablet 0   No current facility-administered medications for this visit.    No Known Allergies  Diagnoses:  Attention deficit hyperactivity disorder (ADHD), combined type  Generalized anxiety disorder   Plan of Care: ***  Jerrilyn Moras out how ot balance all that and manage w/ the ahd focus and anxiety.  To assist in reduing anxiety, irritability and forgetfulnes.    Individualized Treatment Plan Strengths: ***  Supports: ***   Goal/Needs for Treatment:  In order of importance to patient 1) *** 2) *** 3) ***   Client Statement of Needs: ***   Treatment Level:***  Symptoms:***  Client Treatment Preferences:***   Healthcare consumer's goal for treatment:  Counselor, [name], [facility] will support the patient's ability to achieve the goals identified. Cognitive Behavioral Therapy, Assertive Communication/Conflict Resolution Training, Relaxation Training, ACT, Humanistic and other evidenced-based practices will be used to promote progress towards healthy functioning.   Healthcare consumer will: Actively participate in therapy, working towards healthy functioning.    *Justification for Continuation/Discontinuation of Goal: R=Revised, O=Ongoing,  A=Achieved, D=Discontinued  Goal 1)  Baseline date 07/06/23: Progress towards goal 0; How Often - Daily Target Date Goal Was reviewed Status Code Progress towards goal/Likert rating  07/05/24                Goal 2)  Baseline date 07/06/23: Progress towards goal 0; How Often - Daily Target Date Goal Was reviewed Status Code Progress towards goal  07/05/24                Goal 3)  Baseline date ***: Progress towards goal ***; How Often - Daily Target Date Goal Was reviewed Status Code Progress towards goal  ***                This plan has been reviewed and created by the following participants:  This plan will be reviewed at least every 12 months. Date Behavioral Health Clinician Date Guardian/Patient   ***   *** Verbal Consent Provided                    [name] LCMHC

## 2023-07-06 NOTE — Progress Notes (Unsigned)
   Forde Radon, [facility]

## 2023-07-21 ENCOUNTER — Ambulatory Visit (INDEPENDENT_AMBULATORY_CARE_PROVIDER_SITE_OTHER): Admitting: Psychology

## 2023-07-21 DIAGNOSIS — F902 Attention-deficit hyperactivity disorder, combined type: Secondary | ICD-10-CM | POA: Diagnosis not present

## 2023-07-21 DIAGNOSIS — F411 Generalized anxiety disorder: Secondary | ICD-10-CM

## 2023-07-21 NOTE — Progress Notes (Signed)
 [facility] Counselor/Therapist Progress Note  Patient ID: Dana Welch, MRN: [id number],    Date: 07/21/2023  Time Spent: 1200pm-12:45pm    Pt is seen for a virtual video visit.  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  Pt consents to virtual visit and is aware of limitations of such visits.    Treatment Type: Individual Therapy  Reported Symptoms: pt reports less overwhelmed this week.    Mental Status Exam: Appearance:  Well Groomed     Behavior: Appropriate  Motor: Normal  Speech/Language:  Clear and Coherent  Affect: Appropriate  Mood: anxious  Thought process: normal  Thought content:   WNL  Sensory/Perceptual disturbances:   WNL  Orientation: oriented to person, place, time/date, and situation  Attention: Good  Concentration: Good  Memory: WNL  Fund of knowledge:  Good  Insight:   Good  Judgment:  Good  Impulse Control: Good   Risk Assessment: Danger to Self:  No Self-injurious Behavior: No Danger to Others: No Duty to Warn:no Physical Aggression / Violence:No  Access to Firearms a concern: No  Gang Involvement:No   Subjective: Counselor  assessed pt current functioning per pt report.  Processed w/pt change w/ stress and emotions w/ summer schedule starting.  Assisted pt in recognizing things that are beneficial to her w/ feeling less overwhelmed.  Discussed mindfulness and how to incorporate into day and assist w/ relaxing system.  Explored next steps taking to reduce stress.  Pt affect bright.  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  Pt reports she has noticed a difference with being less overwhelmed and more present.  Pt recognized that has more time to attend to household things.  Pt reports she has been able to find more time for things she enjoys.  Pt reports also more quiet time and being able to have dinner made prior to everyone home helps.  Pt  reflected on ways to be intentional about time for mindful breaks.  Pt also discussed want to cut back on pumping as major stressor that still ties her down.     Interventions: Mindfulness Meditation and Solution-Oriented/Positive Psychology  Diagnosis:Attention deficit hyperactivity disorder (ADHD), combined type  Generalized anxiety disorder  Plan: pt to f/u /w counseling in 2 weeks.  Pt to f/u as scheduled w/ PCP.  Individualized Treatment Plan Strengths:  Pt reports she enjoys her community through work.  Pt reports she has a lot of positives in her life and thankful for.  Pt athletic growing up and now enjoys Golf w/ husband.  Enjoys Quilting and sewing- "my outlet to decompress.  Try to do at least 15 minutes/day." On weekends get together w/ some other families w/young kids that got to know through church.  Supports: parents, friend group   Goal/Needs for Treatment:  In order of importance to patient 1) increase balance between various roles/changes 2) improve focus 3) decreased anxiety and irritability   Client Statement of Needs: "I'm hoping that can learn something to try to help reduce my anxiety, irritability and forgetfulness.  I want to figure out how to find balance- parenting, household, self, relationship, work- and manage w/ the ADHD, focus issues and anxiety. "   Treatment Level:outpt counseling  Symptoms:struggles w/ focus, increased forgetfulness,  feeling overwhelmed/overstimulated  Client Treatment Preferences: counseling biweekly.  No medication while breastfeeding   Healthcare consumer's goal for treatment:  Counselor, [name], [facility]  will support the patient's ability to achieve the goals identified. Cognitive Behavioral Therapy, Assertive Communication/Conflict Resolution Training, Relaxation Training, ACT, Humanistic and other evidenced-based practices will be used to promote progress towards healthy functioning.   Healthcare consumer will: Actively  participate in therapy, working towards healthy functioning.    *Justification for Continuation/Discontinuation of Goal: R=Revised, O=Ongoing, A=Achieved, D=Discontinued  Goal 1) Be able to verbalize stressors related to various roles parenting, personal wellbeing, relationships and work and identify values and priorities to find balance for self AEB pt report and therapist observation.    Baseline date 07/06/23: Progress towards goal 0; How Often - Daily Target Date Goal Was reviewed Status Code Progress towards goal/Likert rating  07/05/24                Goal 2) Learn and utilize strategies to assist improving focus, increase daily home function w/ organizational, time management and planning skills AEB pt report and therapist observation. Baseline date 07/06/23: Progress towards goal 0; How Often - Daily Target Date Goal Was reviewed Status Code Progress towards goal  07/05/24                Goal 3) learn and utilize mindfulness skills to assist in coping w/ anxiety, irritability and emotional regulation.   Baseline date 07/06/23: Progress towards goal 0; How Often - Daily Target Date Goal Was reviewed Status Code Progress towards goal  07/05/24                This plan has been reviewed and created by the following participants:  This plan will be reviewed at least every 12 months. Date Behavioral Health Clinician Date Guardian/Patient   07/06/23 [name], [facility] 07/06/23 Verbal Consent Provided and electronic signature obtained          [name] [facility]

## 2023-08-02 ENCOUNTER — Ambulatory Visit: Admitting: Psychology
